# Patient Record
Sex: Male | Born: 1955
Health system: Southern US, Community
[De-identification: ages and names within clinical notes are randomized; demographics above are authoritative.]

## PROBLEM LIST (undated history)

## (undated) DIAGNOSIS — C44310 Basal cell carcinoma of skin of unspecified parts of face: Secondary | ICD-10-CM

## (undated) DIAGNOSIS — Z8489 Family history of other specified conditions: Secondary | ICD-10-CM

## (undated) DIAGNOSIS — F419 Anxiety disorder, unspecified: Secondary | ICD-10-CM

## (undated) DIAGNOSIS — C61 Malignant neoplasm of prostate: Secondary | ICD-10-CM

## (undated) DIAGNOSIS — Z8619 Personal history of other infectious and parasitic diseases: Secondary | ICD-10-CM

## (undated) DIAGNOSIS — H8192 Unspecified disorder of vestibular function, left ear: Secondary | ICD-10-CM

## (undated) DIAGNOSIS — E785 Hyperlipidemia, unspecified: Secondary | ICD-10-CM

## (undated) DIAGNOSIS — B159 Hepatitis A without hepatic coma: Secondary | ICD-10-CM

## (undated) DIAGNOSIS — E8809 Other disorders of plasma-protein metabolism, not elsewhere classified: Secondary | ICD-10-CM

## (undated) DIAGNOSIS — K5730 Diverticulosis of large intestine without perforation or abscess without bleeding: Secondary | ICD-10-CM

## (undated) HISTORY — DX: Anxiety disorder, unspecified: F41.9

## (undated) HISTORY — DX: Diverticulosis of large intestine without perforation or abscess without bleeding: K57.30

## (undated) HISTORY — DX: Personal history of other infectious and parasitic diseases: Z86.19

## (undated) HISTORY — DX: Other disorders of bilirubin metabolism: E80.6

## (undated) HISTORY — DX: Basal cell carcinoma of skin of unspecified parts of face: C44.310

## (undated) HISTORY — PX: CATARACT EXTRACTION, BILATERAL: SHX1313

## (undated) HISTORY — PX: VASECTOMY: SHX75

## (undated) HISTORY — DX: Hyperlipidemia, unspecified: E78.5

---

## 1997-05-28 HISTORY — PX: BASAL CELL CARCINOMA EXCISION: SHX1214

## 1997-08-05 ENCOUNTER — Encounter: Payer: Self-pay | Admitting: Family Medicine

## 1997-08-05 LAB — CONVERTED CEMR LAB
Blood Glucose, Fasting: 103 mg/dL
RBC count: 5.12 10*6/uL
WBC, blood: 11.2 10*3/uL

## 1999-04-12 ENCOUNTER — Encounter: Payer: Self-pay | Admitting: Family Medicine

## 1999-04-12 LAB — CONVERTED CEMR LAB
Blood Glucose, Fasting: 92 mg/dL
RBC count: 5.14 10*6/uL
WBC, blood: 5.4 10*3/uL

## 1999-05-06 ENCOUNTER — Emergency Department (HOSPITAL_COMMUNITY): Admission: EM | Admit: 1999-05-06 | Discharge: 1999-05-07 | Payer: Self-pay | Admitting: Family Medicine

## 1999-05-06 ENCOUNTER — Ambulatory Visit (HOSPITAL_COMMUNITY): Admission: RE | Admit: 1999-05-06 | Discharge: 1999-05-06 | Payer: Self-pay | Admitting: Vascular Surgery

## 1999-05-08 ENCOUNTER — Encounter: Payer: Self-pay | Admitting: Neurology

## 1999-05-08 ENCOUNTER — Ambulatory Visit (HOSPITAL_COMMUNITY): Admission: RE | Admit: 1999-05-08 | Discharge: 1999-05-08 | Payer: Self-pay | Admitting: Neurology

## 2001-01-07 ENCOUNTER — Encounter: Payer: Self-pay | Admitting: Family Medicine

## 2001-01-07 LAB — CONVERTED CEMR LAB
Blood Glucose, Fasting: 99 mg/dL
TSH: 1.69 microintl units/mL

## 2001-03-28 DIAGNOSIS — K5730 Diverticulosis of large intestine without perforation or abscess without bleeding: Secondary | ICD-10-CM

## 2001-03-28 HISTORY — DX: Diverticulosis of large intestine without perforation or abscess without bleeding: K57.30

## 2001-03-29 HISTORY — PX: COLONOSCOPY: SHX174

## 2002-03-13 ENCOUNTER — Encounter: Payer: Self-pay | Admitting: Family Medicine

## 2002-03-13 LAB — CONVERTED CEMR LAB
Blood Glucose, Fasting: 84 mg/dL
TSH: 1.28 microintl units/mL

## 2003-03-18 ENCOUNTER — Encounter: Payer: Self-pay | Admitting: Family Medicine

## 2003-03-18 LAB — CONVERTED CEMR LAB
Blood Glucose, Fasting: 92 mg/dL
TSH: 1.33 microintl units/mL

## 2004-04-10 ENCOUNTER — Ambulatory Visit: Payer: Self-pay | Admitting: Family Medicine

## 2005-05-09 ENCOUNTER — Ambulatory Visit: Payer: Self-pay | Admitting: Family Medicine

## 2005-06-14 ENCOUNTER — Ambulatory Visit: Payer: Self-pay | Admitting: Family Medicine

## 2005-06-29 ENCOUNTER — Ambulatory Visit: Payer: Self-pay | Admitting: Family Medicine

## 2005-07-03 ENCOUNTER — Ambulatory Visit: Payer: Self-pay | Admitting: Family Medicine

## 2006-06-28 DIAGNOSIS — E785 Hyperlipidemia, unspecified: Secondary | ICD-10-CM

## 2006-06-28 HISTORY — DX: Hyperlipidemia, unspecified: E78.5

## 2006-07-01 ENCOUNTER — Ambulatory Visit: Payer: Self-pay | Admitting: Family Medicine

## 2006-07-01 LAB — CONVERTED CEMR LAB
ALT: 49 units/L — ABNORMAL HIGH (ref 0–40)
AST: 29 units/L (ref 0–37)
Albumin: 4.1 g/dL (ref 3.5–5.2)
Alkaline Phosphatase: 49 units/L (ref 39–117)
BUN: 11 mg/dL (ref 6–23)
Bilirubin, Direct: 0.2 mg/dL (ref 0.0–0.3)
Blood Glucose, Fasting: 100 mg/dL
CO2: 30 meq/L (ref 19–32)
Calcium: 9.1 mg/dL (ref 8.4–10.5)
Chloride: 110 meq/L (ref 96–112)
Cholesterol: 208 mg/dL (ref 0–200)
Creatinine, Ser: 1.2 mg/dL (ref 0.4–1.5)
Direct LDL: 159.1 mg/dL
GFR calc Af Amer: 83 mL/min
GFR calc non Af Amer: 69 mL/min
Glucose, Bld: 100 mg/dL — ABNORMAL HIGH (ref 70–99)
HDL: 44.5 mg/dL (ref >39.0)
PSA: 1.19 ng/mL
PSA: 1.19 ng/mL (ref 0.10–4.00)
Potassium: 4.4 meq/L (ref 3.5–5.1)
Sodium: 143 meq/L (ref 135–145)
TSH: 1.27 microintl units/mL
TSH: 1.27 microintl units/mL (ref 0.35–5.50)
Total Bilirubin: 1.5 mg/dL — ABNORMAL HIGH (ref 0.3–1.2)
Total CHOL/HDL Ratio: 4.7
Total Protein: 7.1 g/dL (ref 6.0–8.3)
Triglycerides: 76 mg/dL (ref 0–149)
VLDL: 15 mg/dL (ref 0–40)

## 2006-07-03 ENCOUNTER — Ambulatory Visit: Payer: Self-pay | Admitting: Family Medicine

## 2006-10-07 ENCOUNTER — Ambulatory Visit: Payer: Self-pay | Admitting: Family Medicine

## 2008-03-17 ENCOUNTER — Encounter: Payer: Self-pay | Admitting: Family Medicine

## 2008-03-17 ENCOUNTER — Ambulatory Visit: Payer: Self-pay | Admitting: Family Medicine

## 2008-03-17 DIAGNOSIS — Z8619 Personal history of other infectious and parasitic diseases: Secondary | ICD-10-CM | POA: Insufficient documentation

## 2008-03-17 DIAGNOSIS — E785 Hyperlipidemia, unspecified: Secondary | ICD-10-CM | POA: Insufficient documentation

## 2008-03-17 DIAGNOSIS — K5730 Diverticulosis of large intestine without perforation or abscess without bleeding: Secondary | ICD-10-CM | POA: Insufficient documentation

## 2008-03-17 HISTORY — DX: Diverticulosis of large intestine without perforation or abscess without bleeding: K57.30

## 2008-03-17 HISTORY — DX: Gilbert syndrome: E80.4

## 2008-03-17 LAB — CONVERTED CEMR LAB
Cholesterol, target level: 200 mg/dL
HDL goal, serum: 40 mg/dL
LDL Goal: 160 mg/dL

## 2008-10-20 ENCOUNTER — Encounter: Payer: Self-pay | Admitting: Family Medicine

## 2008-12-01 ENCOUNTER — Telehealth: Payer: Self-pay | Admitting: Family Medicine

## 2008-12-27 ENCOUNTER — Ambulatory Visit: Payer: Self-pay | Admitting: Family Medicine

## 2008-12-27 DIAGNOSIS — F411 Generalized anxiety disorder: Secondary | ICD-10-CM

## 2008-12-27 HISTORY — DX: Generalized anxiety disorder: F41.1

## 2009-03-09 ENCOUNTER — Encounter (INDEPENDENT_AMBULATORY_CARE_PROVIDER_SITE_OTHER): Payer: Self-pay | Admitting: *Deleted

## 2009-03-15 ENCOUNTER — Ambulatory Visit: Payer: Self-pay | Admitting: Family Medicine

## 2009-10-03 ENCOUNTER — Telehealth: Payer: Self-pay | Admitting: Internal Medicine

## 2010-01-02 ENCOUNTER — Encounter (INDEPENDENT_AMBULATORY_CARE_PROVIDER_SITE_OTHER): Payer: Self-pay | Admitting: *Deleted

## 2010-01-13 ENCOUNTER — Encounter (INDEPENDENT_AMBULATORY_CARE_PROVIDER_SITE_OTHER): Payer: Self-pay | Admitting: *Deleted

## 2010-01-31 ENCOUNTER — Encounter (INDEPENDENT_AMBULATORY_CARE_PROVIDER_SITE_OTHER): Payer: Self-pay | Admitting: *Deleted

## 2010-02-01 ENCOUNTER — Ambulatory Visit: Payer: Self-pay | Admitting: Internal Medicine

## 2010-02-15 ENCOUNTER — Ambulatory Visit: Payer: Self-pay | Admitting: Internal Medicine

## 2010-02-15 HISTORY — PX: COLONOSCOPY: SHX174

## 2010-02-15 LAB — HM COLONOSCOPY

## 2010-03-27 ENCOUNTER — Ambulatory Visit: Payer: Self-pay | Admitting: Family Medicine

## 2010-03-28 LAB — CONVERTED CEMR LAB
Albumin: 4.4 g/dL (ref 3.5–5.2)
Alkaline Phosphatase: 63 units/L (ref 39–117)
Basophils Absolute: 0 10*3/uL (ref 0.0–0.1)
CO2: 30 meq/L (ref 19–32)
Calcium: 9.1 mg/dL (ref 8.4–10.5)
Cholesterol: 220 mg/dL — ABNORMAL HIGH (ref 0–200)
Creatinine, Ser: 1.1 mg/dL (ref 0.4–1.5)
Direct LDL: 145.9 mg/dL
Eosinophils Absolute: 0.3 10*3/uL (ref 0.0–0.7)
Glucose, Bld: 88 mg/dL (ref 70–99)
Hemoglobin: 16.5 g/dL (ref 13.0–17.0)
Lymphocytes Relative: 35.1 % (ref 12.0–46.0)
MCHC: 34.9 g/dL (ref 30.0–36.0)
Neutro Abs: 3.8 10*3/uL (ref 1.4–7.7)
PSA: 1.59 ng/mL (ref 0.10–4.00)
RDW: 13 % (ref 11.5–14.6)
Sodium: 141 meq/L (ref 135–145)
TSH: 2 microintl units/mL (ref 0.35–5.50)
Triglycerides: 151 mg/dL — ABNORMAL HIGH (ref 0.0–149.0)

## 2010-03-29 ENCOUNTER — Ambulatory Visit: Payer: Self-pay | Admitting: Family Medicine

## 2010-06-27 NOTE — Miscellaneous (Signed)
Summary: [facility] PV  Clinical Lists Changes  Medications: Added new medication of MOVIPREP 100 GM  SOLR (PEG-KCL-NACL-NASULF-NA ASC-C) As per prep instructions. - Signed Rx of MOVIPREP 100 GM  SOLR (PEG-KCL-NACL-NASULF-NA ASC-C) As per prep instructions.;  #1 x 0;  Signed;  Entered by: Ezra Sites RN;  Authorized by: Hilarie Fredrickson MD;  Method used: Electronically to [facility]. [facility] [phone]*, [address] [place], [place]  [id number], Ph: [phone], Fax: [phone] Observations: Added new observation of ALLERGY REV: Done (02/01/2010 15:36)    Prescriptions: MOVIPREP 100 GM  SOLR (PEG-KCL-NACL-NASULF-NA ASC-C) As per prep instructions.  #1 x 0   Entered by:   Ezra Sites RN   Authorized by:   Hilarie Fredrickson MD   Signed by:   Ezra Sites RN on 02/01/2010   Method used:   Electronically to        [facility]. [address] [phone]* (retail)       [address] [place], [place]  [id number]       Ph: [phone]       Fax: [phone]   RxID:   [id number]

## 2010-06-27 NOTE — Letter (Signed)
Summary: Nadara Eaton letter  [facility] at [facility]  [address] [place], [place] [address]   Phone: [phone]  Fax: [phone]       01/02/2010 MRN: [id number]  RENEE BEALE [address] [facility] [address] [place], [place]  [address]  Dear Mr. BARTOLI,  [facility], and [facility] announce the retirement of Arta Silence, M.D., from full-time practice at the [facility] office effective November 24, 2009 and his plans of returning part-time.  It is important to Dr. Hetty Ely and to our practice that you understand that [facility] has seven physicians in our office for your health care needs.  We will continue to offer the same exceptional care that you have today.    Dr. Hetty Ely has spoken to many of you about his plans for retirement and returning part-time in the fall.   We will continue to work with you through the transition to schedule appointments for you in the office and meet the high standards that [facility] is committed to.   Again, it is with great pleasure that we share the news that Dr. Hetty Ely will return to [facility] in October of 2011 with a reduced schedule.    If you have any questions, or would like to request an appointment with one of our physicians, please call us at [phone] and press the option for Scheduling an appointment.  We take pleasure in providing you with excellent patient care and look forward to seeing you at your next office visit.  Our [facility] Physicians are:  Tillman Abide, M.D. Laurita Quint, M.D. Roxy Manns, M.D. Kerby Nora, M.D. Hannah Beat, M.D. Ruthe Mannan, M.D. We proudly welcomed Raechel Ache, M.D. and Eustaquio Boyden, M.D. to the practice in July/August 2011.  Sincerely,  [facility]
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
## 2010-06-27 NOTE — Progress Notes (Signed)
Summary: Schedule Colonoscopy  Phone Note Outgoing Call Call back at [facility] Phone [phone]   Call placed by: Harlow Mares CMA Duncan Dull),  Oct 03, 2009 4:56 PM Call placed to: Patient Summary of Call: Left message with male at patients home number, she will have patient call back. patient is due for a colonoscopy Initial call taken by: Harlow Mares CMA Duncan Dull),  Oct 03, 2009 4:56 PM  Follow-up for Phone Call        Left message on patients machine to call back. we will mail the pt a letter to remind him that he is due for colonoscopy Follow-up by: Harlow Mares CMA Duncan Dull),  October 27, 2009 3:26 PM

## 2010-06-27 NOTE — Procedures (Signed)
Summary: Colonoscopy  Patient: Christopher Contreras Note: All result statuses are Final unless otherwise noted.  Tests: (1) Colonoscopy (COL)   COL Colonoscopy           DONE (C)     [facility]     [address]     [place], [place]  [address]           COLONOSCOPY PROCEDURE REPORT           PATIENT:  Christopher Contreras, Christopher Contreras  MR#:  [id number]     BIRTHDATE:  04-04-1956, 55 yrs. old  GENDER:  male     ENDOSCOPIST:  Wilhemina Bonito. Eda Keys, MD     REF. BY:  Screening Recall     PROCEDURE DATE:  02/15/2010     PROCEDURE:  Higher-risk screening colonoscopy G0105           ASA CLASS:  Class I     INDICATIONS:  Elevated Risk Screening, family history of colon     cancer - mother age 48. Negative prior exam 03-2001     MEDICATIONS:   Fentanyl 75 mcg IV, Versed 8 mg IV; Benadryl 25mg      IV           DESCRIPTION OF PROCEDURE:   After the risks benefits and     alternatives of the procedure were thoroughly explained, informed     consent was obtained.  Digital rectal exam was performed and     revealed no abnormalities.   The LB [id number] endoscope     was introduced through the anus and advanced to the cecum, which     was identified by both the appendix and ileocecal valve, without     limitations.Time to cecum = 3:58 min. The quality of the prep was     excellent, using MoviPrep.  The instrument was then slowly     withdrawn (time = 13:29 min) as the colon was fully examined.     <<PROCEDUREIMAGES>>           FINDINGS:  Mild diverticulosis was found with a few diverticula     scattered throught the colon.  This was otherwise a normal complete     examination of the colon.  No polyps or cancers were seen.     Retroflexed views in the rectum revealed no abnormalities.    The     scope was then withdrawn from the patient and the procedure     completed.           COMPLICATIONS:  None     ENDOSCOPIC IMPRESSION:     1) Mild diverticulosis     2) Otherwise normal examination     3)  No polyps or cancers           RECOMMENDATIONS:     1) Follow up colonoscopy in 5 years (Family Hx)           ______________________________     Wilhemina Bonito. Eda Keys, MD           CC:  Arta Silence, MD; The Patient           n.     REVISED:  02/15/2010 11:46 AM     eSIGNED:   Wilhemina Bonito. Eda Keys at 02/15/2010 11:46 AM           Elayne Guerin, [id number]  Note: An exclamation mark (!) indicates a result that was not dispersed into the flowsheet. Document Creation  Date: 02/15/2010 11:47 AM _______________________________________________________________________  (1) Order result status: Final Collection or observation date-time: 02/15/2010 11:38 Requested date-time:  Receipt date-time:  Reported date-time:  Referring Physician:   Ordering Physician: Fransico Setters [phone]) Specimen Source:  Source: Launa Grill Order Number: [id number] Lab site:   Appended Document: Colonoscopy    Clinical Lists Changes  Observations: Added new observation of COLONNXTDUE: 01/2015 (02/15/2010 13:47)      Appended Document: Colonoscopy    Clinical Lists Changes

## 2010-06-27 NOTE — Letter (Signed)
Summary: [facility] Instructions  [facility]  [address] [place], [place] [address]   Phone: [phone]  Fax: [phone]       Christopher Contreras    January 18, 1956    MRN: [id number]        Procedure Day Dorna Bloom:  [facility] 02/15/10     Arrival Time:  9:30am     Procedure Time: 10:30am     Location of Procedure:                    _X _  [facility] (4th Floor)                        PREPARATION FOR COLONOSCOPY WITH MOVIPREP   Starting 5 days prior to your procedure  FRIDAY 09/16  do not eat nuts, seeds, popcorn, corn, beans, peas,  salads, or any raw vegetables.  Do not take any fiber supplements (e.g. Metamucil, Citrucel, and Benefiber).  THE DAY BEFORE YOUR PROCEDURE         DATE:  TUESDAY  09/20  1.  Drink clear liquids the entire day-NO SOLID FOOD  2.  Do not drink anything colored red or purple.  Avoid juices with pulp.  No orange juice.  3.  Drink at least 64 oz. (8 glasses) of fluid/clear liquids during the day to prevent dehydration and help the prep work efficiently.  CLEAR LIQUIDS INCLUDE: Water Jello Ice Popsicles Tea (sugar ok, no milk/cream) Powdered fruit flavored drinks Coffee (sugar ok, no milk/cream) Gatorade Juice: apple, white grape, white cranberry  Lemonade Clear bullion, consomm, broth Carbonated beverages (any kind) Strained chicken noodle soup Hard Candy                             4.  In the morning, mix first dose of MoviPrep solution:    Empty 1 Pouch A and 1 Pouch B into the disposable container    Add lukewarm drinking water to the top line of the container. Mix to dissolve    Refrigerate (mixed solution should be used within 24 hrs)  5.  Begin drinking the prep at 5:00 p.m. The MoviPrep container is divided by 4 marks.   Every 15 minutes drink the solution down to the next mark (approximately 8 oz) until the full liter is complete.   6.  Follow completed prep with 16 oz of clear liquid of your choice  (Nothing red or purple).  Continue to drink clear liquids until bedtime.  7.  Before going to bed, mix second dose of MoviPrep solution:    Empty 1 Pouch A and 1 Pouch B into the disposable container    Add lukewarm drinking water to the top line of the container. Mix to dissolve    Refrigerate  THE DAY OF YOUR PROCEDURE      DATE:  [facility]  09/21  Beginning at  5:30 a.m. (5 hours before procedure):         1. Every 15 minutes, drink the solution down to the next mark (approx 8 oz) until the full liter is complete.  2. Follow completed prep with 16 oz. of clear liquid of your choice.    3. You may drink clear liquids until  8:30am  (2 HOURS BEFORE PROCEDURE).   MEDICATION INSTRUCTIONS  Unless otherwise instructed, you should take regular prescription medications with a small sip of water   as  early as possible the morning of your procedure.        OTHER INSTRUCTIONS  You will need a responsible adult at least 53 years of age to accompany you and drive you home.   This person must remain in the waiting room during your procedure.  Wear loose fitting clothing that is easily removed.  Leave jewelry and other valuables at home.  However, you may wish to bring a book to read or  an iPod/MP3 player to listen to music as you wait for your procedure to start.  Remove all body piercing jewelry and leave at home.  Total time from sign-in until discharge is approximately 2-3 hours.  You should go home directly after your procedure and rest.  You can resume normal activities the  day after your procedure.  The day of your procedure you should not:   Drive   Make legal decisions   Operate machinery   Drink alcohol   Return to work  You will receive specific instructions about eating, activities and medications before you leave.    The above instructions have been reviewed and explained to me by   Ezra Sites RN  February 01, 2010 3:58 PM   I fully understand  and can verbalize these instructions _____________________________ Date _________

## 2010-06-27 NOTE — Letter (Signed)
Summary: Previsit letter  [facility]  [address] [place], [place] [address]   Phone: [phone]  Fax: [phone]       01/13/2010 MRN: [id number]  Christopher Contreras [address] [facility] [address] [place], [place]  [address]  Dear [name],  Welcome to the Gastroenterology Division at [facility].    You are scheduled to see a nurse for your pre-procedure visit on 03-03-10 at 3:30p.m. on the 3rd floor at [facility], [address]  We ask that you try to arrive at our office 15 minutes prior to your appointment time to allow for check-in.  Your nurse visit will consist of discussing your medical and surgical history, your immediate family medical history, and your medications.    Please bring a complete list of all your medications or, if you prefer, bring the medication bottles and we will list them.  We will need to be aware of both prescribed and over the counter drugs.  We will need to know exact dosage information as well.  If you are on blood thinners (Coumadin, Plavix, Aggrenox, Ticlid, etc.) please call our office today/prior to your appointment, as we need to consult with your physician about holding your medication.   Please be prepared to read and sign documents such as consent forms, a financial agreement, and acknowledgement forms.  If necessary, and with your consent, a friend or relative is welcome to sit-in on the nurse visit with you.  Please bring your insurance card so that we may make a copy of it.  If your insurance requires a referral to see a specialist, please bring your referral form from your primary care physician.  No co-pay is required for this nurse visit.     If you cannot keep your appointment, please call [phone] to cancel or reschedule prior to your appointment date.  This allows [place] the opportunity to schedule an appointment for another patient in need of care.    Thank you for choosing [facility] for your  medical needs.  We appreciate the opportunity to care for you.  Please visit [place] at our website  to learn more about our practice.                     Sincerely.                                                                                                                   The Gastroenterology Division

## 2011-01-16 ENCOUNTER — Encounter: Payer: Self-pay | Admitting: Family Medicine

## 2011-01-16 ENCOUNTER — Ambulatory Visit (INDEPENDENT_AMBULATORY_CARE_PROVIDER_SITE_OTHER): Payer: 59 | Admitting: Family Medicine

## 2011-01-16 VITALS — BP 126/78 | HR 72 | Temp 98.3°F | Ht 68.0 in | Wt 199.0 lb

## 2011-01-16 DIAGNOSIS — H109 Unspecified conjunctivitis: Secondary | ICD-10-CM

## 2011-01-16 NOTE — Patient Instructions (Signed)
Looks like pink eye - viral infection. No contacts for next 1-2 wks (until better). Use lubricating eye drops (refresh or hypotears, OTC) as well as warm compresses to eye. If fever or worsening discharge, or not better by Friday, let me know.  Pink Eye (Conjunctivitis, Viral) Conjunctivitis is an irritation (inflammation) of the clear membrane that covers the white part of the eye (the conjunctiva). The irritation can also happen on the underside of the eyelids. Conjunctivitis makes the eye red or pink in color. This is what is commonly known as pink eye. Viral conjunctivitis can spread easily (contagious). CAUSES  Infection from virus on the surface of the eye.   Infection from the irritation or injury of nearby tissues such as the eyelids or cornea.   More serious inflammation or infection on the inside of the eye.   Other eye diseases.   The use of certain eye medications.  SYMPTOMS The normally white color of the eye or the underside of the eyelid is usually pink or red in color. The pink eye is usually associated with irritation, tearing and some sensitivity to light. Viral conjunctivitis is often associated with a clear, watery discharge. If a discharge is present, there may also be some blurred vision in the affected eye. DIAGNOSIS Conjunctivitis is diagnosed by an eye exam. The eye specialist looks for changes in the surface tissues of the eye which take on changes characteristic of the specific types of conjunctivitis. A sample of any discharge may be collected on a Q-Tip (sterile swap). The sample will be sent to a lab to see whether or not the inflammation is caused by bacterial or viral infection. TREATMENT Viral conjunctivitis will not respond to medicines that kill germs (antibiotics). Treatment is aimed at stopping a bacterial infection on top of the viral infection. The goal of treatment is to relieve symptoms (such as itching) with antihistamine drops or other eye  medications.  HOME CARE INSTRUCTIONS  To ease discomfort, apply a cool, clean wash cloth to your eye for 10 to 20 minutes, 3 to 4 times a day.   Gently wipe away any drainage from the eye with a warm, wet washcloth or a cotton ball.   Wash your hands often with soap and use paper towels to dry.   Do not share towels or washcloths. This may spread the infection.   Change or wash your pillowcase every day.   You should not use eye make-up until the infection is gone.   Stop using contacts lenses. Ask your eye professional how to sterilize or replace them before using again. This depends on the type of contact lenses used.   Do not touch the edge of the eyelid with the eye drop bottle or ointment tube when applying medications to the affected eye. This will stop you from spreading the infection to the other eye or to others.  SEEK IMMEDIATE MEDICAL CARE IF:  The infection has not improved within 3 days of beginning treatment.   A watery discharge from the eye develops.   Pain in the eye increases.   The redness is spreading.   Vision becomes blurred.   An oral temperature above 101 develops, or as your caregiver suggests.   Facial pain, redness or swelling develops.   Any problems that may be related to the prescribed medicine develop.  MAKE SURE YOU:   Understand these instructions.   Will watch your condition.   Will get help right away if you are not doing  well or get worse.  Document Released: 05/14/2005 Document Re-Released: 04/26/2008 [facility] Patient Information 2011 [place], [place].

## 2011-01-16 NOTE — Progress Notes (Signed)
  Subjective:    Patient ID: Christopher Contreras, male    DOB: 1956-02-08, 55 y.o.   MRN: [id number]  HPI CC: eye infection  3d h/o eye infection, getting worse.  Crusted in am, brown.  Irritated.  Watery.  Not itching.  Cannot remember specific injury.  No vision changes, fevers/chills, congestion, RN, cough.  No pain with eye movements.  No photophobia  No sick contacts at home.  Used to wear contacts, not currently.  Had lasik surgery.  Rarely wears in left eye to help vision at night.  Review of Systems Per HPI    Objective:   Physical Exam  Nursing note and vitals reviewed. Constitutional: He appears well-developed and well-nourished. No distress.  HENT:  Head: Normocephalic and atraumatic.  Mouth/Throat: Oropharynx is clear and moist. No oropharyngeal exudate.  Eyes: EOM and lids are normal. Pupils are equal, round, and reactive to light. No foreign body present in the right eye. No foreign body present in the left eye. Right conjunctiva is not injected. Left conjunctiva is injected.         Bulbar and palpebral injection, limbic sparing EOMI without pain. Extra erythema left lower eyelid midline but no stye noted.  Neck: Normal range of motion. Neck supple.  Lymphadenopathy:    He has no cervical adenopathy.          Assessment & Plan:

## 2011-01-16 NOTE — Assessment & Plan Note (Signed)
Viral conjunctivitis. Treat supportively as per instructions. If red flags or not improved in next 3 days, call us with update.

## 2011-01-19 ENCOUNTER — Telehealth: Payer: Self-pay | Admitting: *Deleted

## 2011-01-19 ENCOUNTER — Encounter: Payer: Self-pay | Admitting: *Deleted

## 2011-01-19 MED ORDER — POLYMYXIN B-TRIMETHOPRIM 10000-0.1 UNIT/ML-% OP SOLN: 1.0000 [drp] | OPHTHALMIC | Status: AC

## 2011-01-19 NOTE — Telephone Encounter (Signed)
Patient advised as instructed via telephone. 

## 2011-01-19 NOTE — Telephone Encounter (Addendum)
Sent in. plz notify pt. If worsening despite this, or fever or worsening pain, to be seen again.

## 2011-01-19 NOTE — Telephone Encounter (Signed)
Pt was seen earlier this week for an eye infection and was told to call back today if not any better- he isn't, in fact eye is worse.  He is asking if an antibiotic can be called to [facility] [address]

## 2011-04-02 ENCOUNTER — Other Ambulatory Visit: Payer: Self-pay

## 2011-04-04 ENCOUNTER — Encounter: Payer: Self-pay | Admitting: Family Medicine

## 2012-05-16 ENCOUNTER — Encounter: Payer: Self-pay | Admitting: Family Medicine

## 2012-05-16 ENCOUNTER — Ambulatory Visit (INDEPENDENT_AMBULATORY_CARE_PROVIDER_SITE_OTHER): Payer: 59 | Admitting: Family Medicine

## 2012-05-16 VITALS — BP 114/78 | HR 116 | Temp 98.5°F | Wt 212.8 lb

## 2012-05-16 DIAGNOSIS — J069 Acute upper respiratory infection, unspecified: Secondary | ICD-10-CM

## 2012-05-16 NOTE — Progress Notes (Signed)
  Subjective:    Patient ID: Christopher Contreras, male    DOB: 07/12/1955, 56 y.o.   MRN: [id number]  HPI CC: feeling ill  3d h/o ST that woke him up from sleep.  Feeling bette during day, then at night starts feeling worse again at night.  Progressive feeling worse as days have progressed.  ++PNdrainage and sinus congestion, body aches, muscle pain.  This afternoon R ear aching.  run down.  Minimal cough.  Last night tried nyquil for sleep which helped some.  No fevers/chills, abd pain, tooth pain, headaches.  No sick contacts at home.  No smoker at home. No h/o asthma/COPD  Did not receive flu shot this year.  Past Medical History  Diagnosis Date  . Diverticulosis of colon 11/02  . HLD (hyperlipidemia) 2/08  . Anxiety   . Disorders of bilirubin excretion   . Personal history of other infectious and parasitic disease   . Basal cell carcinoma of face      Review of Systems Per HPI    Objective:   Physical Exam  Nursing note and vitals reviewed. Constitutional: He appears well-developed and well-nourished. No distress.  HENT:  Head: Normocephalic and atraumatic.  Right Ear: Hearing, tympanic membrane, external ear and ear canal normal.  Left Ear: Hearing, tympanic membrane, external ear and ear canal normal.  Nose: Nose normal. No mucosal edema or rhinorrhea. Right sinus exhibits no maxillary sinus tenderness and no frontal sinus tenderness. Left sinus exhibits no maxillary sinus tenderness and no frontal sinus tenderness.  Mouth/Throat: Uvula is midline and mucous membranes are normal. Posterior oropharyngeal erythema present. No oropharyngeal exudate, posterior oropharyngeal edema or tonsillar abscesses.       Turbinate erythema Soft palate erythema   Eyes: Conjunctivae normal and EOM are normal. Pupils are equal, round, and reactive to light. No scleral icterus.  Neck: Normal range of motion. Neck supple.  Cardiovascular: Normal rate, regular rhythm, normal heart sounds  and intact distal pulses.   No murmur heard. Pulmonary/Chest: Effort normal and breath sounds normal. No respiratory distress. He has no wheezes. He has no rales.  Lymphadenopathy:    He has no cervical adenopathy.  Skin: Skin is warm and dry. No rash noted.       Assessment & Plan:

## 2012-05-16 NOTE — Assessment & Plan Note (Signed)
Mild dehydration as evidenced by mild tachycardia with viral URI possible early sinusitis. supportive care as per instructions.

## 2012-05-16 NOTE — Patient Instructions (Signed)
Sounds like you have a viral upper respiratory infection. Antibiotics are not needed for this.  Viral infections usually take 7-10 days to resolve.  The cough can last a few weeks to go away. Take simple mucinex or guaifenesin alone with plenty of fluid to mobilize mucous. Ibuprofen for sinus inflammation. Push fluids and plenty of rest. Let us know if fever >101, worsening productive cough, symptoms past 10 days, or sudden deterioration after initially improving. I hope you start feeling better. Push fluids as you may be a bit dehydrated.

## 2013-10-06 DIAGNOSIS — M76891 Other specified enthesopathies of right lower limb, excluding foot: Secondary | ICD-10-CM | POA: Insufficient documentation

## 2015-02-16 ENCOUNTER — Encounter: Payer: Self-pay | Admitting: Internal Medicine

## 2015-04-18 ENCOUNTER — Encounter: Payer: Self-pay | Admitting: Internal Medicine

## 2016-07-06 DIAGNOSIS — J208 Acute bronchitis due to other specified organisms: Secondary | ICD-10-CM | POA: Diagnosis not present

## 2016-07-06 DIAGNOSIS — B9689 Other specified bacterial agents as the cause of diseases classified elsewhere: Secondary | ICD-10-CM | POA: Diagnosis not present

## 2016-07-20 ENCOUNTER — Ambulatory Visit (INDEPENDENT_AMBULATORY_CARE_PROVIDER_SITE_OTHER): Payer: 59 | Admitting: Family Medicine

## 2016-07-20 ENCOUNTER — Encounter: Payer: Self-pay | Admitting: Family Medicine

## 2016-07-20 VITALS — BP 128/76 | HR 96 | Temp 98.0°F | Wt 201.0 lb

## 2016-07-20 DIAGNOSIS — R1013 Epigastric pain: Secondary | ICD-10-CM

## 2016-07-20 DIAGNOSIS — R5383 Other fatigue: Secondary | ICD-10-CM

## 2016-07-20 HISTORY — DX: Epigastric pain: R10.13

## 2016-07-20 HISTORY — DX: Other fatigue: R53.83

## 2016-07-20 LAB — COMPREHENSIVE METABOLIC PANEL
ALBUMIN: 4.2 g/dL (ref 3.6–5.1)
ALT: 63 U/L — ABNORMAL HIGH (ref 9–46)
AST: 37 U/L — ABNORMAL HIGH (ref 10–35)
Alkaline Phosphatase: 71 U/L (ref 40–115)
BUN: 12 mg/dL (ref 7–25)
CHLORIDE: 105 mmol/L (ref 98–110)
CO2: 24 mmol/L (ref 20–31)
Calcium: 8.9 mg/dL (ref 8.6–10.3)
Creat: 1.27 mg/dL (ref 0.70–1.33)
Glucose, Bld: 81 mg/dL (ref 65–99)
POTASSIUM: 4.3 mmol/L (ref 3.5–5.3)
SODIUM: 139 mmol/L (ref 135–146)
TOTAL PROTEIN: 7.1 g/dL (ref 6.1–8.1)
Total Bilirubin: 1.4 mg/dL — ABNORMAL HIGH (ref 0.2–1.2)

## 2016-07-20 LAB — CBC WITH DIFFERENTIAL/PLATELET
BASOS ABS: 81 {cells}/uL (ref 0–200)
Basophils Relative: 1 %
EOS ABS: 162 {cells}/uL (ref 15–500)
EOS PCT: 2 %
HCT: 42.9 % (ref 38.5–50.0)
HEMOGLOBIN: 14.7 g/dL (ref 13.2–17.1)
Lymphocytes Relative: 58 %
Lymphs Abs: 4698 cells/uL — ABNORMAL HIGH (ref 850–3900)
MCH: 30.1 pg (ref 27.0–33.0)
MCHC: 34.3 g/dL (ref 32.0–36.0)
MCV: 87.7 fL (ref 80.0–100.0)
MPV: 8.6 fL (ref 7.5–12.5)
Monocytes Absolute: 891 cells/uL (ref 200–950)
Monocytes Relative: 11 %
NEUTROS ABS: 2268 {cells}/uL (ref 1500–7800)
NEUTROS PCT: 28 %
Platelets: 194 10*3/uL (ref 140–400)
RBC: 4.89 MIL/uL (ref 4.20–5.80)
RDW: 14 % (ref 11.0–15.0)
WBC: 8.1 10*3/uL (ref 3.8–10.8)

## 2016-07-20 LAB — TSH: TSH: 1.45 m[IU]/L (ref 0.40–4.50)

## 2016-07-20 MED ORDER — OMEPRAZOLE 40 MG PO CPDR: 40.0000 mg | DELAYED_RELEASE_CAPSULE | Freq: Every day | ORAL | 1 refills | Status: DC

## 2016-07-20 MED ORDER — SUCRALFATE 1 G PO TABS: 1.0000 g | ORAL_TABLET | Freq: Three times a day (TID) | ORAL | 1 refills | Status: DC

## 2016-07-20 NOTE — Patient Instructions (Addendum)
I think the doxycycline and vomiting episode led to some ongoing stomach/esophagus irritation Treat with omeprazole 40mg  daily for 3 weeks as well as carafate with meals.  Update [place] with effect after 2-3 weeks - if no better, we will refer you to GI for further evaluation.  Check with Dr Blanch Media office about repeat colonoscopy.

## 2016-07-20 NOTE — Progress Notes (Signed)
BP 128/76   Pulse 96   Temp 98 F (36.7 C) (Oral)   Wt 201 lb (91.2 kg)   BMI 30.56 kg/m    CC: malaise, ongoing epigastric pain Subjective:    Patient ID: Christopher Contreras, male    DOB: 03-05-1956, 61 y.o.   MRN: [id number]  HPI: Christopher Contreras is a 61 y.o. male presenting on 07/20/2016 for Has been sick for 2 months (has had flu, bronchitis since Christmas; no energy; feels like food is not going all the way down)   Increased family stress 12/2015 when father became ill, then died on trip to [place]. Family stressors present as well - child, strained relationship between sisters and MIL.   He had influenza A after Christmas seen at walk in clinic, treated with tamiflu. Never missed work. Did feel some better, but symptoms recurred. Returned to walk in clinic and received CXR, dx as acute bronchitis, treated with 1 wk doxycycline. This caused GI distress and episode of severe vomiting - this was about 2-3 wks ago. Doxy caused some constipation as well but that has improved off abx.   Over the last 2 weeks, noticing ongoing discomfort in chest described as something stuck in chest, worse with meals, some trouble swallowing. Some GERD and increased belching, indigestion. Some feverish. Occasional abd discomfort described as cramping pain. No more nausea/vomiting, diarrhea. Appetite decreased due to discomfort with meals. No coughing or wheezing. No boring pain to the back. No significant dysphagia, no weight loss, no night sweats. No further vomiting.   Has tried nexium which may have helped some.   Decreased energy over the last few months. Normally walks 50,000 steps. But now tires walking around the house. He did cut down on his schedule to 40hr/wk this year. Some orthostatic dizziness.   No tick bites this past year.  No recent travel.  No sick contacts.   Last seen here 04/2012.   Relevant past medical, surgical, family and social history reviewed and updated as indicated.  Interim medical history since our last visit reviewed. Allergies and medications reviewed and updated. Outpatient Medications Prior to Visit  Medication Sig Dispense Refill  . aspirin 81 MG tablet Take 81 mg by mouth daily.      . Multiple Vitamin (MULTIVITAMIN) tablet Take 1 tablet by mouth daily.       No facility-administered medications prior to visit.      Per HPI unless specifically indicated in ROS section below Review of Systems     Objective:    BP 128/76   Pulse 96   Temp 98 F (36.7 C) (Oral)   Wt 201 lb (91.2 kg)   BMI 30.56 kg/m   Wt Readings from Last 3 Encounters:  07/20/16 201 lb (91.2 kg)  05/16/12 212 lb 12 oz (96.5 kg)  01/16/11 199 lb (90.3 kg)    Physical Exam  Constitutional: He appears well-developed and well-nourished. No distress.  HENT:  Head: Normocephalic and atraumatic.  Mouth/Throat: Oropharynx is clear and moist. No oropharyngeal exudate.  Eyes: Conjunctivae and EOM are normal. Pupils are equal, round, and reactive to light. No scleral icterus.  Neck: Normal range of motion. Neck supple. Carotid bruit is not present. No thyromegaly present.  Cardiovascular: Normal rate, regular rhythm, normal heart sounds and intact distal pulses.   No murmur heard. Pulmonary/Chest: Effort normal and breath sounds normal. No respiratory distress. He has no wheezes. He has no rales.  Abdominal: Soft. Normal appearance and bowel sounds  are normal. He exhibits no distension and no mass. There is no hepatosplenomegaly. There is no tenderness. There is no rigidity, no rebound, no guarding, no CVA tenderness and negative Murphy's sign.  Musculoskeletal: He exhibits no edema.  Lymphadenopathy:    He has no cervical adenopathy.  Skin: Skin is warm and dry. No rash noted.  Psychiatric: He has a normal mood and affect.  Nursing note and vitals reviewed.  Results for orders placed or performed in visit on 01/16/11  HM COLONOSCOPY  Result Value Ref Range   HM  Colonoscopy Done       Assessment & Plan:   Problem List Items Addressed This Visit    Abdominal discomfort, epigastric - Primary    Started after acute vomiting episode after doxycycline course. Anticipate gastritis/esophagitis after doxy/vomiting. Treat with 3 wk PPI and carafate. Update if not improved for GI referral. Pt agrees with plan.       Relevant Orders   CBC with Differential/Platelet   Comprehensive metabolic panel   Lipase   Fatigue    Check labs for reversible causes of fatigue.  Update with results.       Relevant Orders   CBC with Differential/Platelet   TSH   Vitamin B12       Follow up plan: Return if symptoms worsen or fail to improve.  Ria Bush, MD

## 2016-07-20 NOTE — Assessment & Plan Note (Signed)
Started after acute vomiting episode after doxycycline course. Anticipate gastritis/esophagitis after doxy/vomiting. Treat with 3 wk PPI and carafate. Update if not improved for GI referral. Pt agrees with plan.

## 2016-07-20 NOTE — Assessment & Plan Note (Signed)
Check labs for reversible causes of fatigue.  Update with results.

## 2016-07-20 NOTE — Progress Notes (Signed)
Pre visit review using our clinic review tool, if applicable. No additional management support is needed unless otherwise documented below in the visit note. 

## 2016-07-21 LAB — VITAMIN B12: VITAMIN B 12: 618 pg/mL (ref 200–1100)

## 2016-07-21 LAB — LIPASE: LIPASE: 43 U/L (ref 7–60)

## 2016-07-25 ENCOUNTER — Other Ambulatory Visit: Payer: Self-pay | Admitting: Family Medicine

## 2016-07-25 DIAGNOSIS — R5383 Other fatigue: Secondary | ICD-10-CM

## 2016-07-26 ENCOUNTER — Other Ambulatory Visit (INDEPENDENT_AMBULATORY_CARE_PROVIDER_SITE_OTHER): Payer: 59

## 2016-07-26 DIAGNOSIS — R5383 Other fatigue: Secondary | ICD-10-CM | POA: Diagnosis not present

## 2016-07-26 LAB — TESTOSTERONE: TESTOSTERONE: 442.68 ng/dL (ref 300.00–890.00)

## 2016-07-27 ENCOUNTER — Telehealth: Payer: Self-pay | Admitting: Family Medicine

## 2016-07-27 ENCOUNTER — Encounter: Payer: Self-pay | Admitting: Family Medicine

## 2016-07-27 NOTE — Telephone Encounter (Signed)
Patient called to find out his lab results.  He said he's checking my chart for the results.

## 2016-07-27 NOTE — Telephone Encounter (Signed)
Released via mychart

## 2016-10-13 ENCOUNTER — Encounter: Payer: Self-pay | Admitting: Family Medicine

## 2016-10-13 ENCOUNTER — Other Ambulatory Visit: Payer: Self-pay | Admitting: Family Medicine

## 2016-10-13 DIAGNOSIS — R5383 Other fatigue: Secondary | ICD-10-CM

## 2016-10-13 DIAGNOSIS — E785 Hyperlipidemia, unspecified: Secondary | ICD-10-CM

## 2016-10-13 DIAGNOSIS — R740 Nonspecific elevation of levels of transaminase and lactic acid dehydrogenase [LDH]: Secondary | ICD-10-CM

## 2016-10-13 DIAGNOSIS — R7401 Elevation of levels of liver transaminase levels: Secondary | ICD-10-CM

## 2016-10-13 DIAGNOSIS — Z125 Encounter for screening for malignant neoplasm of prostate: Secondary | ICD-10-CM

## 2016-10-16 ENCOUNTER — Encounter: Payer: Self-pay | Admitting: Family Medicine

## 2016-10-16 ENCOUNTER — Other Ambulatory Visit (INDEPENDENT_AMBULATORY_CARE_PROVIDER_SITE_OTHER): Payer: 59

## 2016-10-16 DIAGNOSIS — Z125 Encounter for screening for malignant neoplasm of prostate: Secondary | ICD-10-CM

## 2016-10-16 DIAGNOSIS — R740 Nonspecific elevation of levels of transaminase and lactic acid dehydrogenase [LDH]: Secondary | ICD-10-CM

## 2016-10-16 DIAGNOSIS — E785 Hyperlipidemia, unspecified: Secondary | ICD-10-CM

## 2016-10-16 DIAGNOSIS — R7401 Elevation of levels of liver transaminase levels: Secondary | ICD-10-CM

## 2016-10-16 LAB — FERRITIN: FERRITIN: 145.6 ng/mL (ref 22.0–322.0)

## 2016-10-16 LAB — COMPREHENSIVE METABOLIC PANEL
ALT: 24 U/L (ref 0–53)
AST: 21 U/L (ref 0–37)
Albumin: 4.5 g/dL (ref 3.5–5.2)
Alkaline Phosphatase: 57 U/L (ref 39–117)
BILIRUBIN TOTAL: 2.3 mg/dL — AB (ref 0.2–1.2)
BUN: 15 mg/dL (ref 6–23)
CALCIUM: 9 mg/dL (ref 8.4–10.5)
CO2: 30 meq/L (ref 19–32)
Chloride: 106 mEq/L (ref 96–112)
Creatinine, Ser: 1.17 mg/dL (ref 0.40–1.50)
GFR: 67.93 mL/min (ref >60.00)
Glucose, Bld: 100 mg/dL — ABNORMAL HIGH (ref 70–99)
Potassium: 4.5 mEq/L (ref 3.5–5.1)
Sodium: 142 mEq/L (ref 135–145)
Total Protein: 7 g/dL (ref 6.0–8.3)

## 2016-10-16 LAB — LIPID PANEL
CHOL/HDL RATIO: 4
Cholesterol: 168 mg/dL (ref 0–200)
HDL: 38.6 mg/dL — AB (ref >39.00)
LDL Cholesterol: 110 mg/dL — ABNORMAL HIGH (ref 0–99)
NonHDL: 129.72
TRIGLYCERIDES: 97 mg/dL (ref 0.0–149.0)
VLDL: 19.4 mg/dL (ref 0.0–40.0)

## 2016-10-16 LAB — PSA: PSA: 4.6 ng/mL — ABNORMAL HIGH (ref 0.10–4.00)

## 2016-10-23 DIAGNOSIS — R972 Elevated prostate specific antigen [PSA]: Secondary | ICD-10-CM | POA: Diagnosis not present

## 2016-10-23 DIAGNOSIS — R350 Frequency of micturition: Secondary | ICD-10-CM | POA: Diagnosis not present

## 2016-10-23 DIAGNOSIS — N401 Enlarged prostate with lower urinary tract symptoms: Secondary | ICD-10-CM | POA: Diagnosis not present

## 2016-10-29 DIAGNOSIS — R972 Elevated prostate specific antigen [PSA]: Secondary | ICD-10-CM | POA: Diagnosis not present

## 2016-11-08 DIAGNOSIS — N401 Enlarged prostate with lower urinary tract symptoms: Secondary | ICD-10-CM | POA: Diagnosis not present

## 2016-11-08 DIAGNOSIS — R350 Frequency of micturition: Secondary | ICD-10-CM | POA: Diagnosis not present

## 2016-11-08 DIAGNOSIS — R972 Elevated prostate specific antigen [PSA]: Secondary | ICD-10-CM | POA: Diagnosis not present

## 2016-11-12 ENCOUNTER — Encounter: Payer: Self-pay | Admitting: Family Medicine

## 2016-11-12 DIAGNOSIS — N400 Enlarged prostate without lower urinary tract symptoms: Secondary | ICD-10-CM | POA: Insufficient documentation

## 2016-11-12 HISTORY — DX: Benign prostatic hyperplasia without lower urinary tract symptoms: N40.0

## 2017-02-13 ENCOUNTER — Ambulatory Visit (INDEPENDENT_AMBULATORY_CARE_PROVIDER_SITE_OTHER): Payer: 59 | Admitting: Family Medicine

## 2017-02-13 ENCOUNTER — Encounter: Payer: Self-pay | Admitting: Family Medicine

## 2017-02-13 VITALS — BP 112/62 | HR 58 | Temp 98.1°F

## 2017-02-13 DIAGNOSIS — N529 Male erectile dysfunction, unspecified: Secondary | ICD-10-CM | POA: Insufficient documentation

## 2017-02-13 DIAGNOSIS — R070 Pain in throat: Secondary | ICD-10-CM

## 2017-02-13 HISTORY — DX: Male erectile dysfunction, unspecified: N52.9

## 2017-02-13 MED ORDER — SILDENAFIL CITRATE 100 MG PO TABS: 50.0000 mg | ORAL_TABLET | Freq: Every day | ORAL | 3 refills | Status: DC | PRN

## 2017-02-13 NOTE — Assessment & Plan Note (Addendum)
RST today. Overall benign exam. ?viral pharyngitis. No signs of bacterial pharyngitis or deep tissue throat infection.  Treat conservatively with NSAID, honey/lemon, herbal tea, popsicles, salt water gargles Red flags to return discussed.

## 2017-02-13 NOTE — Patient Instructions (Signed)
You may have viral pharyngitis.  Treat with ibuprofen 600mg  3 times a day with meals for next 5 days, may use honey with lemon, herbal tea, popsicles, salt water gargles - anything that can soothe the throat.  If not improving with this, let me know for ENT referral.  If worsening pain, fever >101, trouble opening mouth, or hoarser voice, let me know sooner.

## 2017-02-13 NOTE — Progress Notes (Signed)
BP 112/62 (BP Location: Left Arm, Patient Position: Sitting, Cuff Size: Normal)   Pulse (!) 58   Temp 98.1 F (36.7 C) (Oral)   SpO2 98%    CC: ST, earache Subjective:    Patient ID: [name], male    DOB: [birth year], 61 y.o.   MRN: [id number]  HPI: NICOLIS BOODY is a 61 y.o. male presenting on 02/13/2017 for Sore Throat (Started 02/07/17) and Otalgia   6d h/o L earache with radiation down to throat whenever eating or drinking anything. Progressively worsening. Preceded by mild congestion initially. Odynophagia > chewing. Pain with both eating and drinking.   Treating with tylenol and advil but this doesn't help when eating. Pain waking him up at night time.   No fevers/chills, no trismus, cough, headache, post nasal drainage or facial pain. No hoarse voice. No GERD sxs.  No sick contacts at home.  Non smoker Rare alcohol.  Oh by the way - requests viagra refill for ED - remotely on this and it was effective.   Relevant past medical, surgical, family and social history reviewed and updated as indicated. Interim medical history since our last visit reviewed. Allergies and medications reviewed and updated. Outpatient Medications Prior to Visit  Medication Sig Dispense Refill  . Multiple Vitamin (MULTIVITAMIN) tablet Take 1 tablet by mouth daily.      Marland Kitchen omeprazole (PRILOSEC) 40 MG capsule Take 1 capsule (40 mg total) by mouth daily. For 3 weeks then as needed 30 capsule 1  . aspirin 81 MG tablet Take 81 mg by mouth daily.      . sucralfate (CARAFATE) 1 g tablet Take 1 tablet (1 g total) by mouth 3 (three) times daily before meals. 60 tablet 1   No facility-administered medications prior to visit.      Per HPI unless specifically indicated in ROS section below Review of Systems     Objective:    BP 112/62 (BP Location: Left Arm, Patient Position: Sitting, Cuff Size: Normal)   Pulse (!) 58   Temp 98.1 F (36.7 C) (Oral)   SpO2 98%   Wt Readings from Last 3  Encounters:  07/20/16 201 lb (91.2 kg)  05/16/12 212 lb 12 oz (96.5 kg)  01/16/11 199 lb (90.3 kg)    Physical Exam  Constitutional: He appears well-developed and well-nourished. No distress.  HENT:  Head: Normocephalic and atraumatic.  Right Ear: Hearing, tympanic membrane, external ear and ear canal normal.  Left Ear: Hearing, tympanic membrane, external ear and ear canal normal.  Nose: Nose normal. No mucosal edema or rhinorrhea. Right sinus exhibits no maxillary sinus tenderness and no frontal sinus tenderness. Left sinus exhibits no maxillary sinus tenderness and no frontal sinus tenderness.  Mouth/Throat: Uvula is midline and mucous membranes are normal. Posterior oropharyngeal erythema (mild) present. No oropharyngeal exudate, posterior oropharyngeal edema or tonsillar abscesses.  Mild nasal mucosal congestion No dental pain to palpation Normal stetson's duct No parotid swelling or other salivary gland swelling  Eyes: Pupils are equal, round, and reactive to light. Conjunctivae and EOM are normal. No scleral icterus.  Neck: Normal range of motion. Neck supple. No thyromegaly present.  Cardiovascular: Normal rate, regular rhythm, normal heart sounds and intact distal pulses.   No murmur heard. Pulmonary/Chest: Effort normal and breath sounds normal. No respiratory distress. He has no wheezes. He has no rales.  Lymphadenopathy:       Head (right side): Tonsillar (mild) adenopathy present. No submental, no submandibular, no preauricular, no  posterior auricular and no occipital adenopathy present.       Head (left side): No submental, no submandibular, no tonsillar, no preauricular, no posterior auricular and no occipital adenopathy present.    He has no cervical adenopathy.  Skin: Skin is warm and dry. No rash noted.  Nursing note and vitals reviewed.     Assessment & Plan:   Problem List Items Addressed This Visit    Erectile dysfunction    Discussed generic vs brand viagra. Pt  prefers to try brand. Coupon provided today. Has previously tolerated well.      Throat pain in adult - Primary    RST today. Overall benign exam. ?viral pharyngitis. No signs of bacterial pharyngitis or deep tissue throat infection.  Treat conservatively with NSAID, honey/lemon, herbal tea, popsicles, salt water gargles Red flags to return discussed.          Follow up plan: Return if symptoms worsen or fail to improve.  Ria Bush, MD

## 2017-02-13 NOTE — Assessment & Plan Note (Addendum)
Discussed generic vs brand viagra. Pt prefers to try brand. Coupon provided today. Has previously tolerated well.

## 2017-04-16 DIAGNOSIS — H43813 Vitreous degeneration, bilateral: Secondary | ICD-10-CM | POA: Diagnosis not present

## 2017-05-03 DIAGNOSIS — R350 Frequency of micturition: Secondary | ICD-10-CM | POA: Diagnosis not present

## 2017-05-03 DIAGNOSIS — N401 Enlarged prostate with lower urinary tract symptoms: Secondary | ICD-10-CM | POA: Diagnosis not present

## 2017-05-03 DIAGNOSIS — R972 Elevated prostate specific antigen [PSA]: Secondary | ICD-10-CM | POA: Diagnosis not present

## 2017-05-30 DIAGNOSIS — Z85828 Personal history of other malignant neoplasm of skin: Secondary | ICD-10-CM | POA: Diagnosis not present

## 2017-05-30 DIAGNOSIS — Z1283 Encounter for screening for malignant neoplasm of skin: Secondary | ICD-10-CM | POA: Diagnosis not present

## 2017-05-30 DIAGNOSIS — D229 Melanocytic nevi, unspecified: Secondary | ICD-10-CM | POA: Diagnosis not present

## 2017-06-04 DIAGNOSIS — H524 Presbyopia: Secondary | ICD-10-CM | POA: Diagnosis not present

## 2017-08-28 DIAGNOSIS — L853 Xerosis cutis: Secondary | ICD-10-CM | POA: Diagnosis not present

## 2017-08-28 DIAGNOSIS — L570 Actinic keratosis: Secondary | ICD-10-CM | POA: Diagnosis not present

## 2017-09-13 DIAGNOSIS — M62830 Muscle spasm of back: Secondary | ICD-10-CM | POA: Diagnosis not present

## 2017-09-13 DIAGNOSIS — M545 Low back pain: Secondary | ICD-10-CM | POA: Diagnosis not present

## 2017-09-13 DIAGNOSIS — M9903 Segmental and somatic dysfunction of lumbar region: Secondary | ICD-10-CM | POA: Diagnosis not present

## 2017-09-16 DIAGNOSIS — M62830 Muscle spasm of back: Secondary | ICD-10-CM | POA: Diagnosis not present

## 2017-09-16 DIAGNOSIS — M9903 Segmental and somatic dysfunction of lumbar region: Secondary | ICD-10-CM | POA: Diagnosis not present

## 2017-09-16 DIAGNOSIS — M545 Low back pain: Secondary | ICD-10-CM | POA: Diagnosis not present

## 2017-09-18 DIAGNOSIS — M62830 Muscle spasm of back: Secondary | ICD-10-CM | POA: Diagnosis not present

## 2017-09-18 DIAGNOSIS — M545 Low back pain: Secondary | ICD-10-CM | POA: Diagnosis not present

## 2017-09-18 DIAGNOSIS — M9903 Segmental and somatic dysfunction of lumbar region: Secondary | ICD-10-CM | POA: Diagnosis not present

## 2017-09-20 DIAGNOSIS — M62830 Muscle spasm of back: Secondary | ICD-10-CM | POA: Diagnosis not present

## 2017-09-20 DIAGNOSIS — M545 Low back pain: Secondary | ICD-10-CM | POA: Diagnosis not present

## 2017-09-20 DIAGNOSIS — M9903 Segmental and somatic dysfunction of lumbar region: Secondary | ICD-10-CM | POA: Diagnosis not present

## 2017-11-08 DIAGNOSIS — N401 Enlarged prostate with lower urinary tract symptoms: Secondary | ICD-10-CM | POA: Diagnosis not present

## 2017-11-08 DIAGNOSIS — R972 Elevated prostate specific antigen [PSA]: Secondary | ICD-10-CM | POA: Diagnosis not present

## 2018-01-10 ENCOUNTER — Encounter: Payer: Self-pay | Admitting: Internal Medicine

## 2018-01-10 ENCOUNTER — Ambulatory Visit (INDEPENDENT_AMBULATORY_CARE_PROVIDER_SITE_OTHER): Payer: 59 | Admitting: Internal Medicine

## 2018-01-10 VITALS — BP 116/74 | HR 74 | Temp 97.9°F | Ht 68.0 in | Wt 194.0 lb

## 2018-01-10 DIAGNOSIS — L237 Allergic contact dermatitis due to plants, except food: Secondary | ICD-10-CM

## 2018-01-10 DIAGNOSIS — L259 Unspecified contact dermatitis, unspecified cause: Secondary | ICD-10-CM | POA: Insufficient documentation

## 2018-01-10 MED ORDER — TRIAMCINOLONE ACETONIDE 0.1 % EX CREA: 1.0000 "application " | TOPICAL_CREAM | Freq: Two times a day (BID) | CUTANEOUS | 1 refills | Status: DC | PRN

## 2018-01-10 NOTE — Assessment & Plan Note (Signed)
Based on time course, probably just about peaking in intensity Does okay with left over cream---can hold off on prednisone TAC If continues to worsen, would try prednisone

## 2018-01-10 NOTE — Progress Notes (Signed)
Subjective:    Patient ID: Christopher Contreras, male    DOB: 26-Apr-1956, 62 y.o.   MRN: [id number]  HPI Here due to rash Was trimming bushes and trees last weekend Started with rash and itching the next day Has cream that is helping some--but rash is worsening Concerned due to still spreading some  Current Outpatient Medications on File Prior to Visit  Medication Sig Dispense Refill  . Multiple Vitamin (MULTIVITAMIN) tablet Take 1 tablet by mouth daily.      . sildenafil (VIAGRA) 100 MG tablet Take 0.5-1 tablets (50-100 mg total) by mouth daily as needed for erectile dysfunction. 5 tablet 3   No current facility-administered medications on file prior to visit.     Allergies  Allergen Reactions  . Sulfonamide Derivatives     Past Medical History:  Diagnosis Date  . Anxiety   . Basal cell carcinoma of face   . Disorders of bilirubin excretion   . Diverticulosis of colon 11/02  . HLD (hyperlipidemia) 2/08  . Personal history of other infectious and parasitic disease     Past Surgical History:  Procedure Laterality Date  . BASAL CELL CARCINOMA EXCISION  1/99   right face  . COLONOSCOPY  03/29/01   Diverticulosis  . COLONOSCOPY  02/15/10   [facility]-Dr. Henrene Pastor  . VASECTOMY  pre 1999   Dr. Rogers Blocker    Family History  Problem Relation Age of Onset  . Prostate cancer Father        s/p prostatectomy  . Other Father        Back pain with neuropathy  . Colon cancer Mother 12  . Colon polyps Sister   . Colon polyps Sister   . Heart attack Unknown        Maternal side  . Hypertension Unknown        Both sides  . Diabetes Son   . Diabetes Paternal Grandfather   . Bone cancer Maternal Grandmother     Social History   Socioeconomic History  . Marital status: Married    Spouse name: Not on file  . Number of children: 3  . Years of education: Not on file  . Highest education level: Not on file  Occupational History  . Occupation: Retired  Scientific laboratory technician  . Financial  resource strain: Not on file  . Food insecurity:    Worry: Not on file    Inability: Not on file  . Transportation needs:    Medical: Not on file    Non-medical: Not on file  Tobacco Use  . Smoking status: Never Smoker  . Smokeless tobacco: Never Used  Substance and Sexual Activity  . Alcohol use: Yes    Comment: Rare  . Drug use: No  . Sexual activity: Not on file  Lifestyle  . Physical activity:    Days per week: Not on file    Minutes per session: Not on file  . Stress: Not on file  Relationships  . Social connections:    Talks on phone: Not on file    Gets together: Not on file    Attends religious service: Not on file    Active member of club or organization: Not on file    Attends meetings of clubs or organizations: Not on file    Relationship status: Not on file  . Intimate partner violence:    Fear of current or ex partner: Not on file    Emotionally abused: Not on file  Physically abused: Not on file    Forced sexual activity: Not on file  Other Topics Concern  . Not on file  Social History Narrative   Married; lives with wife      3 sons      Retired International aid/development worker; [employer]         Review of Systems No fever Feels fine    Objective:   Physical Exam  Skin:  Lacy redness across left posterior calf and part of right Some satellite lesions More classic linear papulovesicular areas on right hand and left forearm/hand            Assessment & Plan:

## 2018-02-03 DIAGNOSIS — L821 Other seborrheic keratosis: Secondary | ICD-10-CM | POA: Diagnosis not present

## 2018-02-03 DIAGNOSIS — L820 Inflamed seborrheic keratosis: Secondary | ICD-10-CM | POA: Diagnosis not present

## 2018-03-18 DIAGNOSIS — J209 Acute bronchitis, unspecified: Secondary | ICD-10-CM | POA: Diagnosis not present

## 2018-03-18 DIAGNOSIS — R05 Cough: Secondary | ICD-10-CM | POA: Diagnosis not present

## 2018-05-15 DIAGNOSIS — R972 Elevated prostate specific antigen [PSA]: Secondary | ICD-10-CM | POA: Diagnosis not present

## 2018-05-15 DIAGNOSIS — N401 Enlarged prostate with lower urinary tract symptoms: Secondary | ICD-10-CM | POA: Diagnosis not present

## 2018-05-26 ENCOUNTER — Other Ambulatory Visit: Payer: Self-pay | Admitting: Urology

## 2018-05-26 ENCOUNTER — Other Ambulatory Visit (HOSPITAL_COMMUNITY): Payer: Self-pay | Admitting: Urology

## 2018-05-26 DIAGNOSIS — R972 Elevated prostate specific antigen [PSA]: Secondary | ICD-10-CM

## 2018-06-02 DIAGNOSIS — L570 Actinic keratosis: Secondary | ICD-10-CM | POA: Diagnosis not present

## 2018-06-02 DIAGNOSIS — D485 Neoplasm of uncertain behavior of skin: Secondary | ICD-10-CM | POA: Diagnosis not present

## 2018-06-02 DIAGNOSIS — Z1283 Encounter for screening for malignant neoplasm of skin: Secondary | ICD-10-CM | POA: Diagnosis not present

## 2018-06-02 DIAGNOSIS — L578 Other skin changes due to chronic exposure to nonionizing radiation: Secondary | ICD-10-CM | POA: Diagnosis not present

## 2018-06-02 DIAGNOSIS — Z85828 Personal history of other malignant neoplasm of skin: Secondary | ICD-10-CM | POA: Diagnosis not present

## 2018-06-10 ENCOUNTER — Ambulatory Visit
Admission: RE | Admit: 2018-06-10 | Discharge: 2018-06-10 | Disposition: A | Discharge status: Home / Self Care | Payer: BLUE CROSS/BLUE SHIELD | Source: Ambulatory Visit | Attending: Urology | Admitting: Urology

## 2018-06-10 DIAGNOSIS — R972 Elevated prostate specific antigen [PSA]: Secondary | ICD-10-CM

## 2018-06-10 LAB — POCT I-STAT CREATININE: Creatinine, Ser: 1.3 mg/dL — ABNORMAL HIGH (ref 0.61–1.24)

## 2018-06-10 MED ORDER — GADOBUTROL 1 MMOL/ML IV SOLN
9.0000 mL | Freq: Once | INTRAVENOUS | Status: AC | PRN
  Administered 2018-06-10: 9 mL via INTRAVENOUS

## 2018-06-12 DIAGNOSIS — N401 Enlarged prostate with lower urinary tract symptoms: Secondary | ICD-10-CM | POA: Diagnosis not present

## 2018-06-12 DIAGNOSIS — R972 Elevated prostate specific antigen [PSA]: Secondary | ICD-10-CM | POA: Diagnosis not present

## 2018-06-26 ENCOUNTER — Other Ambulatory Visit

## 2018-07-01 ENCOUNTER — Ambulatory Visit: Admit: 2018-07-01 | Admitting: Urology

## 2018-07-01 DIAGNOSIS — R972 Elevated prostate specific antigen [PSA]: Secondary | ICD-10-CM | POA: Diagnosis not present

## 2018-07-01 SURGERY — BIOPSY, PROSTATE
Anesthesia: Choice

## 2018-07-08 DIAGNOSIS — R972 Elevated prostate specific antigen [PSA]: Secondary | ICD-10-CM | POA: Diagnosis not present

## 2018-07-08 DIAGNOSIS — C61 Malignant neoplasm of prostate: Secondary | ICD-10-CM | POA: Diagnosis not present

## 2018-07-09 ENCOUNTER — Encounter: Payer: Self-pay | Admitting: Family Medicine

## 2018-07-09 DIAGNOSIS — C61 Malignant neoplasm of prostate: Secondary | ICD-10-CM | POA: Insufficient documentation

## 2018-07-09 DIAGNOSIS — Z8546 Personal history of malignant neoplasm of prostate: Secondary | ICD-10-CM | POA: Insufficient documentation

## 2018-07-22 DIAGNOSIS — C61 Malignant neoplasm of prostate: Secondary | ICD-10-CM | POA: Diagnosis not present

## 2018-07-25 ENCOUNTER — Encounter: Payer: Self-pay | Admitting: Family Medicine

## 2018-07-28 ENCOUNTER — Telehealth: Payer: Self-pay | Admitting: Family Medicine

## 2018-07-28 DIAGNOSIS — Z1211 Encounter for screening for malignant neoplasm of colon: Secondary | ICD-10-CM

## 2018-07-28 NOTE — Telephone Encounter (Signed)
Pt need referral for colonoscopy for [place]. Pt don't need an appt until end of march and beyond

## 2018-07-28 NOTE — Telephone Encounter (Signed)
Referral placed. Pt request after end of march due to other healthcare appointments.

## 2018-08-04 ENCOUNTER — Encounter: Payer: Self-pay | Admitting: Radiation Oncology

## 2018-08-04 NOTE — Progress Notes (Signed)
GU Location of Tumor / Histology: prostatic adenocarcinoma  If Prostate Cancer, Gleason Score is (3 + 4) and PSA is (10.9). Prostate volume: 37.7 cc.   KENTRELL HALLAHAN has a strong family hx of prostate ca. He underwent an initial prostate biopsy in June 2018 for a PSA of 4.6. The biopsy was benign. His PSA increased to 10.9 of January 2020.   Biopsies of prostate (if applicable) revealed:    Past/Anticipated interventions by urology, if any: prostate biopsy (negative), MR/US fusion biopsy, referral for consideration of radiation therapy  Past/Anticipated interventions by medical oncology, if any: no  Weight changes, if any: no  Bowel/Bladder complaints, if any: SHIM 25. IPSS 2. Denies dysuria or hematuria. Reports minimal intermittent post void dribble.    Nausea/Vomiting, if any: no  Pain issues, if any:  no  SAFETY ISSUES:  Prior radiation? no  Pacemaker/ICD? no  Possible current pregnancy? no, male patient  Is the patient on methotrexate? no  Current Complaints / other details:  63 year old male. Married with one daughter and two sons. Works as an Glass blower/designer. Prostate cancer in Grandfather (tx with radiation), father (tx with seeds), and uncle (tx with surgery). Patient denies taking any routine medications. Resides in [place], [place]   Patient leaning toward surgical treatment.

## 2018-08-05 ENCOUNTER — Other Ambulatory Visit: Payer: Self-pay

## 2018-08-05 ENCOUNTER — Encounter: Payer: Self-pay | Admitting: Medical Oncology

## 2018-08-05 ENCOUNTER — Ambulatory Visit
Admission: RE | Admit: 2018-08-05 | Discharge: 2018-08-05 | Disposition: A | Discharge status: Home / Self Care | Payer: BLUE CROSS/BLUE SHIELD | Source: Ambulatory Visit | Attending: Radiation Oncology | Admitting: Radiation Oncology

## 2018-08-05 ENCOUNTER — Encounter: Payer: Self-pay | Admitting: Radiation Oncology

## 2018-08-05 ENCOUNTER — Encounter: Payer: Self-pay | Admitting: Family Medicine

## 2018-08-05 VITALS — BP 125/84 | HR 68 | Temp 98.0°F | Resp 18 | Ht 68.0 in | Wt 194.2 lb

## 2018-08-05 DIAGNOSIS — Z8042 Family history of malignant neoplasm of prostate: Secondary | ICD-10-CM | POA: Insufficient documentation

## 2018-08-05 DIAGNOSIS — Z8719 Personal history of other diseases of the digestive system: Secondary | ICD-10-CM | POA: Diagnosis not present

## 2018-08-05 DIAGNOSIS — Z85828 Personal history of other malignant neoplasm of skin: Secondary | ICD-10-CM | POA: Insufficient documentation

## 2018-08-05 DIAGNOSIS — E785 Hyperlipidemia, unspecified: Secondary | ICD-10-CM | POA: Insufficient documentation

## 2018-08-05 DIAGNOSIS — C61 Malignant neoplasm of prostate: Secondary | ICD-10-CM | POA: Diagnosis not present

## 2018-08-05 DIAGNOSIS — F419 Anxiety disorder, unspecified: Secondary | ICD-10-CM | POA: Insufficient documentation

## 2018-08-05 DIAGNOSIS — R972 Elevated prostate specific antigen [PSA]: Secondary | ICD-10-CM | POA: Diagnosis not present

## 2018-08-05 HISTORY — DX: Malignant neoplasm of prostate: C61

## 2018-08-05 NOTE — Progress Notes (Signed)
Radiation Oncology         [phone] ________________________________  Initial Outpatient Consultation  Name: Christopher Contreras MRN: [id number]  Date: 08/05/2018  DOB: 1955-12-11  [id number], Garlon Hatchet, MD  Raynelle Bring, MD   REFERRING PHYSICIAN: Raynelle Bring, MD  DIAGNOSIS: 63 y.o. gentleman with Stage T1c adenocarcinoma of the prostate with Gleason score of 3+4, and PSA of 10.9.    ICD-10-CM   1. Malignant neoplasm of prostate ([place]) C61     HISTORY OF PRESENT ILLNESS: Christopher Contreras is a 63 y.o. male with a diagnosis of prostate cancer. He has a strong family history of prostate cancer and has been undergoing routine PSA screening. He was noted to have an elevated PSA of 4.6 and underwent an initial prostate biopsy in June 2018 by Dr. Maryan Puls. This biopsy was benign. However, his PSA has continued to rise and was increased to 10.9 in December 2019. This prompted an MRI of the prostate, performed on 06/10/2018, that showed a 1.7 cm PI-RADS 5 nodule in the left anterior apex which involves the anterior peripheral zone, transition zone, and fibromuscular stroma. There was no evidence of extracapsular extension or pelvic metastatic disease. He was self-referred to Dr. Alinda Money for a second opinion on 07/01/2018, and a digital rectal examination was performed at that time revealing no prostate nodules.  The patient proceeded to MRI fusion biopsy of the prostate on 07/08/2018.  The prostate volume measured 37.7 cc.  Out of 16 total core biopsies, 5 were positive.  The maximum Gleason score was 3+4, and this was seen in 4 out of 4 targeted ROI biopsy samples. Additionally, there was Gleason 3+3 disease seen in the right apex.  Biopsies of prostate revealed:    The patient reviewed the biopsy results with his urologist and he has kindly been referred today for discussion of potential radiation treatment options.  PREVIOUS RADIATION THERAPY: No  PAST MEDICAL HISTORY:  Past Medical  History:  Diagnosis Date  . Anxiety   . Basal cell carcinoma of face   . Disorders of bilirubin excretion   . Diverticulosis of colon 11/02  . HLD (hyperlipidemia) 2/08  . Personal history of other infectious and parasitic disease   . Prostate cancer (Home)       PAST SURGICAL HISTORY: Past Surgical History:  Procedure Laterality Date  . BASAL CELL CARCINOMA EXCISION  1/99   right face  . COLONOSCOPY  03/29/01   Diverticulosis  . COLONOSCOPY  02/15/10   [facility]-Dr. Henrene Pastor  . VASECTOMY  pre 1999   Dr. Rogers Blocker    FAMILY HISTORY:  Family History  Problem Relation Age of Onset  . Prostate cancer Father        s/p prostatectomy  . Other Father        Back pain with neuropathy  . Colon cancer Mother 89  . Colon polyps Sister   . Colon polyps Sister   . Heart attack Other        Maternal side  . Hypertension Other        Both sides  . Diabetes Son   . Diabetes Paternal Grandfather   . Bone cancer Maternal Grandmother     SOCIAL HISTORY:  Social History   Socioeconomic History  . Marital status: Married    Spouse name: Not on file  . Number of children: 3  . Years of education: Not on file  . Highest education level: Not on file  Occupational History  . Occupation: Retired  Social Needs  . Financial resource strain: Not on file  . Food insecurity:    Worry: Not on file    Inability: Not on file  . Transportation needs:    Medical: Not on file    Non-medical: Not on file  Tobacco Use  . Smoking status: Never Smoker  . Smokeless tobacco: Never Used  Substance and Sexual Activity  . Alcohol use: Yes    Comment: Rare  . Drug use: No  . Sexual activity: Yes  Lifestyle  . Physical activity:    Days per week: Not on file    Minutes per session: Not on file  . Stress: Not on file  Relationships  . Social connections:    Talks on phone: Not on file    Gets together: Not on file    Attends religious service: Not on file    Active member of club or  organization: Not on file    Attends meetings of clubs or organizations: Not on file    Relationship status: Not on file  . Intimate partner violence:    Fear of current or ex partner: Not on file    Emotionally abused: Not on file    Physically abused: Not on file    Forced sexual activity: Not on file  Other Topics Concern  . Not on file  Social History Narrative   Married; lives with wife      3 sons      Retired International aid/development worker; [employer]          ALLERGIES: Sulfonamide derivatives  MEDICATIONS:  Current Outpatient Medications  Medication Sig Dispense Refill  . sildenafil (VIAGRA) 100 MG tablet Take 0.5-1 tablets (50-100 mg total) by mouth daily as needed for erectile dysfunction. (Patient not taking: Reported on 08/05/2018) 5 tablet 3  . triamcinolone cream (KENALOG) 0.1 % Apply 1 application topically 2 (two) times daily as needed. (Patient not taking: Reported on 06/16/2018) 45 g 1   No current facility-administered medications for this encounter.     REVIEW OF SYSTEMS:  On review of systems, the patient reports that he is doing well overall. He denies any chest pain, shortness of breath, cough, fevers, chills, night sweats, or unintended weight changes. He denies any bowel disturbances, and denies abdominal pain, nausea or vomiting. He denies any new musculoskeletal or joint aches or pains. His IPSS was 2, indicating mild urinary symptoms with minimal intermittent post-void dribble. He denies dysuria or hematuria. His SHIM was 25, indicating he does not have erectile dysfunction. A complete review of systems is obtained and is otherwise negative.    PHYSICAL EXAM:  Wt Readings from Last 3 Encounters:  08/05/18 194 lb 3.2 oz (88.1 kg)  01/10/18 194 lb (88 kg)  07/20/16 201 lb (91.2 kg)   Temp Readings from Last 3 Encounters:  08/05/18 98 F (36.7 C)  01/10/18 97.9 F (36.6 C) (Oral)  02/13/17 98.1 F (36.7 C) (Oral)   BP Readings from Last 3 Encounters:  08/05/18 125/84    01/10/18 116/74  02/13/17 112/62   Pulse Readings from Last 3 Encounters:  08/05/18 68  01/10/18 74  02/13/17 (!) 58   Pain Assessment Pain Score: 0-No pain/10  In general this is a well appearing caucasian male in no acute distress. He is alert and oriented x4 and appropriate throughout the examination. HEENT reveals that the patient is normocephalic, atraumatic. EOMs are intact. PERRLA. Skin is intact without any evidence of gross lesions. Cardiovascular exam reveals  a regular rate and rhythm, no clicks rubs or murmurs are auscultated. Chest is clear to auscultation bilaterally. Lymphatic assessment is performed and does not reveal any adenopathy in the cervical, supraclavicular, axillary, or inguinal chains. Abdomen has active bowel sounds in all quadrants and is intact. The abdomen is soft, non tender, non distended. Lower extremities are negative for pretibial pitting edema, deep calf tenderness, cyanosis or clubbing.   KPS = 100  100 - Normal; no complaints; no evidence of disease. 90   - Able to carry on normal activity; minor signs or symptoms of disease. 80   - Normal activity with effort; some signs or symptoms of disease. 29   - Cares for self; unable to carry on normal activity or to do active work. 60   - Requires occasional assistance, but is able to care for most of his personal needs. 50   - Requires considerable assistance and frequent medical care. 53   - Disabled; requires special care and assistance. 36   - Severely disabled; hospital admission is indicated although death not imminent. 39   - Very sick; hospital admission necessary; active supportive treatment necessary. 10   - Moribund; fatal processes progressing rapidly. 0     - Dead  Karnofsky DA, Abelmann Souris, Craver LS and [facility] [phone]) The use of the nitrogen mustards in the palliative treatment of carcinoma: with particular reference to bronchogenic carcinoma Cancer 1 634-56  LABORATORY DATA:  Lab  Results  Component Value Date   WBC 8.1 07/20/2016   HGB 14.7 07/20/2016   HCT 42.9 07/20/2016   MCV 87.7 07/20/2016   PLT 194 07/20/2016   Lab Results  Component Value Date   NA 142 10/16/2016   K 4.5 10/16/2016   CL 106 10/16/2016   CO2 30 10/16/2016   Lab Results  Component Value Date   ALT 24 10/16/2016   AST 21 10/16/2016   ALKPHOS 57 10/16/2016   BILITOT 2.3 (H) 10/16/2016     RADIOGRAPHY: No results found.    IMPRESSION/PLAN: 1. 63 y.o. gentleman with Stage T1c adenocarcinoma of the prostate with Gleason Score of 3+4, and PSA of 10.9. We discussed the patient's workup and outlined the nature of prostate cancer in this setting. The patient's T stage, Gleason's score, and PSA put him into the favorable-intermediate risk group. Accordingly, he is eligible for a variety of potential treatment options including brachytherapy, 5.5 weeks of external radiation, or radical prostatectomy. We discussed the available radiation techniques, and focused on the details of logistics and delivery. We discussed and outlined the risks, benefits, short and long-term effects associated with radiotherapy and compared and contrasted these with prostatectomy. We also discussed the role of SpaceOAR in reducing the rectal toxicity associated with radiotherapy.   The patient focused most of his questions and interest in robotic-assisted laparoscopic radical prostatectomy. We discussed some of the potential advantages of surgery including surgical staging, the availability of salvage radiotherapy to the prostatic fossa, and the confidence associated with immediate biochemical response. We discussed some of the potential proven indications for postoperative radiotherapy including positive margins, extracapsular extension, and seminal vesicle involvement. We also talked about some of the other potential findings leading to a recommendation for radiotherapy including a non-zero postoperative PSA and positive  lymph nodes.  He was encouraged to ask questions that were answered to his stated satisfaction.  At the end of the conversation the patient is undecided about which treatment option he would like to pursue at this  time but is leaning towards radical prostatectomy.  He will let us know if he decides to pursue radiotherapy and in that case, we would be more than happy to continue to participate in his care.  Otherwise, we will look forward to following his progress. He was encouraged to call with any additional questions.  We spent 60 minutes face to face with the patient and more than 50% of that time was spent in counseling and/or coordination of care.    Nicholos Johns, PA-C    Tyler Pita, MD  [facility] Direct Dial: [phone]  Fax: [phone] [email or web address]  Skype  [email or web address]  This document serves as a record of services personally performed by Tyler Pita, MD and Freeman Caldron, PA-C. It was created on their behalf by Rae Lips, a trained medical scribe. The creation of this record is based on the scribe's personal observations and the providers' statements to them. This document has been checked and approved by the attending providers.

## 2018-08-05 NOTE — Progress Notes (Signed)
See progress note under physician encounter. 

## 2018-08-06 NOTE — Telephone Encounter (Signed)
See pt. Email.

## 2018-08-14 DIAGNOSIS — M6281 Muscle weakness (generalized): Secondary | ICD-10-CM | POA: Diagnosis not present

## 2018-08-14 DIAGNOSIS — C61 Malignant neoplasm of prostate: Secondary | ICD-10-CM | POA: Diagnosis not present

## 2018-08-21 DIAGNOSIS — N393 Stress incontinence (female) (male): Secondary | ICD-10-CM | POA: Diagnosis not present

## 2018-08-21 DIAGNOSIS — M6281 Muscle weakness (generalized): Secondary | ICD-10-CM | POA: Diagnosis not present

## 2018-08-21 DIAGNOSIS — M62838 Other muscle spasm: Secondary | ICD-10-CM | POA: Diagnosis not present

## 2018-08-27 ENCOUNTER — Other Ambulatory Visit: Payer: Self-pay | Admitting: Urology

## 2018-08-29 NOTE — Progress Notes (Signed)
Christopher Contreras very interested in prostatectomy but here today to discuss radiation treatment options. After consult he has decided on surgery.

## 2018-09-09 ENCOUNTER — Encounter (HOSPITAL_COMMUNITY): Payer: Self-pay

## 2018-09-09 ENCOUNTER — Inpatient Hospital Stay (HOSPITAL_COMMUNITY): Admission: RE | Admit: 2018-09-09 | Payer: BLUE CROSS/BLUE SHIELD | Source: Ambulatory Visit

## 2018-09-10 NOTE — Pre-Procedure Instructions (Signed)
[facility]  09/10/2018      [id number] [address] [place] [address] Phone: [phone] Fax: [phone]    Your procedure is scheduled on Monday April 20th.  Report to [facility] Admitting at 10:00 A.M.  Call this number if you have problems the morning of surgery:  [phone]   Remember:  Do not eat or drink after midnight.    Take these medicines the morning of surgery with A SIP OF WATER NONE  7 days prior to surgery STOP taking any Aspirin(unless otherwise instructed by your surgeon), Aleve, Naproxen, Ibuprofen, Motrin, Advil, Goody's, BC's, all herbal medications, fish oil, and all vitamins  Your doctor has ordered for you to drink 8oz of Magnesium Citrate at [facility] the day before your surgery (09/14/18). Your doctor has also ordered for you to use 1 FLEET's Enema the night before your surgery.    Do not wear jewelry.  Do not wear lotions, powders, or colognes, or deodorant.  Men may shave face and neck.  Do not bring valuables to the hospital.  [facility] is not responsible for any belongings or valuables.  Contacts, eyeglasses, hearing aids, dentures or bridgework may not be worn into surgery.  Leave your suitcase in the car.  After surgery it may be brought to your room.  For patients admitted to the hospital, discharge time will be determined by your treatment team.  Patients discharged the day of surgery will not be allowed to drive home.   [facility]- Preparing For Surgery  Before surgery, you can play an important role. Because skin is not sterile, your skin needs to be as free of germs as possible. You can reduce the number of germs on your skin by washing with CHG (chlorahexidine gluconate) Soap before surgery.  CHG is an antiseptic cleaner which kills germs and bonds with the skin to continue killing germs even after washing.    Oral  Hygiene is also important to reduce your risk of infection.  Remember - BRUSH YOUR TEETH THE MORNING OF SURGERY WITH YOUR REGULAR TOOTHPASTE  Please do not use if you have an allergy to CHG or antibacterial soaps. If your skin becomes reddened/irritated stop using the CHG.  Do not shave (including legs and underarms) for at least 48 hours prior to first CHG shower. It is OK to shave your face.  Please follow these instructions carefully.   1. Shower the NIGHT BEFORE SURGERY and the MORNING OF SURGERY with CHG.   2. If you chose to wash your hair, wash your hair first as usual with your normal shampoo.  3. After you shampoo, rinse your hair and body thoroughly to remove the shampoo.  4. Use CHG as you would any other liquid soap. You can apply CHG directly to the skin and wash gently with a scrungie or a clean washcloth.   5. Apply the CHG Soap to your body ONLY FROM THE NECK DOWN.  Do not use on open wounds or open sores. Avoid contact with your eyes, ears, mouth and genitals (private parts). Wash Face and genitals (private parts)  with your normal soap.  6. Wash thoroughly, paying special attention to the area where your surgery will be performed.  7. Thoroughly rinse your body with warm water from the neck down.  8. DO NOT shower/wash with your normal soap after using and  rinsing off the CHG Soap.  9. Pat yourself dry with a CLEAN TOWEL.  10. Wear CLEAN PAJAMAS to bed the night before surgery, wear comfortable clothes the morning of surgery  11. Place CLEAN SHEETS on your bed the night of your first shower and DO NOT SLEEP WITH PETS.    Day of Surgery: Shower as stated above. Do not apply any deodorants/lotions.  Please wear clean clothes to the hospital/surgery center.   Remember to brush your teeth WITH YOUR REGULAR TOOTHPASTE.   Please read over the following fact sheets that you were given.

## 2018-09-11 ENCOUNTER — Encounter (HOSPITAL_COMMUNITY)
Admission: RE | Admit: 2018-09-11 | Discharge: 2018-09-11 | Disposition: A | Discharge status: Home / Self Care | Payer: BLUE CROSS/BLUE SHIELD | Source: Ambulatory Visit | Attending: Urology | Admitting: Urology

## 2018-09-11 ENCOUNTER — Other Ambulatory Visit: Payer: Self-pay

## 2018-09-11 ENCOUNTER — Encounter (HOSPITAL_COMMUNITY): Payer: Self-pay

## 2018-09-11 DIAGNOSIS — C61 Malignant neoplasm of prostate: Secondary | ICD-10-CM | POA: Diagnosis not present

## 2018-09-11 DIAGNOSIS — Z01812 Encounter for preprocedural laboratory examination: Secondary | ICD-10-CM | POA: Insufficient documentation

## 2018-09-11 HISTORY — DX: Hepatitis a without hepatic coma: B15.9

## 2018-09-11 HISTORY — DX: Unspecified disorder of vestibular function, left ear: H81.92

## 2018-09-11 HISTORY — DX: Other disorders of plasma-protein metabolism, not elsewhere classified: E88.09

## 2018-09-11 HISTORY — DX: Family history of other specified conditions: Z84.89

## 2018-09-11 LAB — CBC
HCT: 46.6 % (ref 39.0–52.0)
Hemoglobin: 15.8 g/dL (ref 13.0–17.0)
MCH: 30 pg (ref 26.0–34.0)
MCHC: 33.9 g/dL (ref 30.0–36.0)
MCV: 88.6 fL (ref 80.0–100.0)
Platelets: 181 10*3/uL (ref 150–400)
RBC: 5.26 MIL/uL (ref 4.22–5.81)
RDW: 12.3 % (ref 11.5–15.5)
WBC: 6.2 10*3/uL (ref 4.0–10.5)
nRBC: 0 % (ref 0.0–0.2)

## 2018-09-11 LAB — TYPE AND SCREEN
ABO/RH(D): A NEG
Antibody Screen: NEGATIVE

## 2018-09-11 LAB — BASIC METABOLIC PANEL
Anion gap: 9 (ref 5–15)
BUN: 14 mg/dL (ref 6–20)
CO2: 24 mmol/L (ref 22–32)
Calcium: 9.2 mg/dL (ref 8.9–10.3)
Chloride: 105 mmol/L (ref 98–111)
Creatinine, Ser: 1.15 mg/dL (ref 0.61–1.24)
GFR calc Af Amer: >60 mL/min (ref >60)
GFR calc non Af Amer: >60 mL/min (ref >60)
Glucose, Bld: 99 mg/dL (ref 70–99)
Potassium: 4.2 mmol/L (ref 3.5–5.1)
Sodium: 138 mmol/L (ref 135–145)

## 2018-09-11 LAB — ABO/RH: ABO/RH(D): A NEG

## 2018-09-11 NOTE — Progress Notes (Signed)
Patient reports family hx of pseudocholinesterase deficiency. He states this was told to him by his sister but patient unable to recall if any of his blood relatives have ever had any complications with general anesthesia; he states " I'd have to ask my sister more about it". Patient does remember that his sister told him their mother and father are both difficult to arouse after having anesthesia. Patient attempted to contact his sister while here at pre-op appt but was unable to get her on the phone. Jeneen Rinks, [place] made aware. Patient hard chart to be given to [place], [place] for F/U.

## 2018-09-11 NOTE — Anesthesia Preprocedure Evaluation (Addendum)
Anesthesia Evaluation  Patient identified by MRN, date of birth, ID band Patient awake  General Assessment Comment:Family Hx of Pseudocholinesterase deficiency   Reviewed: Allergy & Precautions, NPO status , Patient's Chart, lab work & pertinent test results  History of Anesthesia Complications (+) PSEUDOCHOLINESTERASE DEFICIENCY, DIFFICULT IV STICK / SPECIAL LINE and Family history of anesthesia reaction  Airway Mallampati: I  TM Distance: >3 FB Neck ROM: Full    Dental no notable dental hx. (+) Teeth Intact, Dental Advisory Given   Pulmonary    Pulmonary exam normal breath sounds clear to auscultation       Cardiovascular Normal cardiovascular exam Rhythm:Regular Rate:Normal     Neuro/Psych negative neurological ROS     GI/Hepatic   Endo/Other  negative endocrine ROS  Renal/GU Cr 1.15 K+ 4.2   Prostate CA    Musculoskeletal negative musculoskeletal ROS (+)   Abdominal   Peds  Hematology Hgb 15.8 Plt 181   Anesthesia Other Findings All: sulfa  Reproductive/Obstetrics                            Anesthesia Physical Anesthesia Plan  ASA: II  Anesthesia Plan: General   Post-op Pain Management:    Induction: Intravenous, Cricoid pressure planned and Rapid sequence  PONV Risk Score and Plan: 2 and Treatment may vary due to age or medical condition, Ondansetron and Dexamethasone  Airway Management Planned: Oral ETT  Additional Equipment:   Intra-op Plan:   Post-operative Plan: Extubation in OR  Informed Consent: I have reviewed the patients History and Physical, chart, labs and discussed the procedure including the risks, benefits and alternatives for the proposed anesthesia with the patient or authorized representative who has indicated his/her understanding and acceptance.     Dental advisory given  Plan Discussed with: CRNA  Anesthesia Plan Comments: (Pt reported he  believes he has a family history of pseudocholinesterase deficiency but is unsure of the details. His sister, Gaye Alken, is an OR nurse at [employer], and he said she would know the details and provided contact information. I spoke with [place] and she stated that both their mother and maternal grandmother had bloodwork that was positive for pseudocholinesterase deficiency. Testing was prompted by their great aunt requiring prolonged intubation following surgery. Neither the pt nor his sister have themselves been tested. Given family history, succinylcholine and mivacurium should be avoided. )     Anesthesia Quick Evaluation

## 2018-09-12 ENCOUNTER — Other Ambulatory Visit: Payer: Self-pay | Admitting: Urology

## 2018-09-12 NOTE — H&P (Signed)
CC/HPI: CC: Prostate Cancer   PCP: Dr. Ria Bush   [name] is a 63 year old gentleman with a history of an elevated PSA who underwent his initial prostate biopsy in June 2018 by Dr. Mare Ferrari for an elevated PSA of 4.6. His biopsy was benign. His PSA continued to increased up to 10.9 and he underwent an MRI of the prostate on 06/10/18 that indicated a 1.7 cm PI-RADS 5 lesion of the left anterior apex. He presented to me for a 2nd opinion and elected to undergo an MR/US fusion biopsy on 07/08/18. This confirmed Gleason 3+4=7 (Prognostic grade group 2) adenocarcinoma in 4 out of 4 targeted biopsies with 5 out of 16 total cores positive for malignancy.   Family history: He has a strong family history of prostate cancer including his father, multiple uncles, and a grandfather.   Imaging studies: MRI (06/10/18) - No EPE, SVI, or LAD.   PMH: He has no major medical comorbidities.  PSH: No abdominal surgeries.   TNM stage: cT1c N0 Mx  PSA: 10.9  Gleason score: 3+4=7  Biopsy (07/08/18): 5/16 cores positive  Left: Benign  Right: R apex (10%, 3+3=6)  MR targets: L ant apex (4/4 cores - 40%, 30%, 30%, 30%, 3+4=7)  Prostate volume: 37.7 cc   Nomogram  OC disease: 43%  EPE: 56%  SVI: 3%  LNI: 3%  PFS (5 year, 10 year): 82%, 71%   Urinary function: IPSS is 0.  Erectile function: SHIM score is 20.     ALLERGIES: Sulfa    MEDICATIONS: Diazepam 10 mg tablet 1 tablet PO 30-60 minutes prior to procedure     GU PSH: Prostate Needle Biopsy - 07/08/2018    NON-GU PSH: Cataract surgery, Bilateral Surgical Pathology, Gross And Microscopic Examination For Prostate Needle - 07/08/2018    GU PMH: Elevated PSA - 07/01/2018    NON-GU PMH: Hepatitis A    FAMILY HISTORY: 1 Daughter - Daughter 2 sons - Son Colon Cancer - Mother Death In The Family Father - Father Death In The Family Mother - Mother Kidney Stones - Mother, Father Prostate Cancer - Grandfather, Father, Uncle    SOCIAL HISTORY: Marital Status: Married Current Smoking Status: Patient has never smoked.   Tobacco Use Assessment Completed: Used Tobacco in last 30 days? Drinks 1 drink per month.  Drinks 1 caffeinated drink per day. Patient's occupation Chartered certified accountant.    REVIEW OF SYSTEMS:    GU Review Male:   Patient denies frequent urination, hard to postpone urination, burning/ pain with urination, get up at night to urinate, leakage of urine, stream starts and stops, trouble starting your streams, and have to strain to urinate .  Gastrointestinal (Lower):   Patient denies diarrhea and constipation.  Gastrointestinal (Upper):   Patient denies nausea and vomiting.  Constitutional:   Patient denies fever, night sweats, weight loss, and fatigue.  Skin:   Patient denies skin rash/ lesion and itching.  Eyes:   Patient denies blurred vision and double vision.  Ears/ Nose/ Throat:   Patient denies sore throat and sinus problems.  Hematologic/Lymphatic:   Patient denies swollen glands and easy bruising.  Cardiovascular:   Patient denies leg swelling and chest pains.  Respiratory:   Patient denies cough and shortness of breath.  Endocrine:   Patient denies excessive thirst.  Musculoskeletal:   Patient denies back pain and joint pain.  Neurological:   Patient denies headaches and dizziness.  Psychologic:   Patient denies  depression and anxiety.   VITAL SIGNS:     Weight 190 lb / 86.18 kg  Height 68 in / 172.72 cm  BMI 28.9 kg/m   MULTI-SYSTEM PHYSICAL EXAMINATION:    Constitutional: Well-nourished. No physical deformities. Normally developed. Good grooming.  Respiratory: No labored breathing, no use of accessory muscles.   Cardiovascular: Normal temperature, normal extremity pulses, no swelling, no varicosities.     ASSESSMENT:      ICD-10 Details  1 GU:   Prostate Cancer - C61    PLAN:     1. Prostate cancer: He has elected surgical treatment and will undergo a bilateral  nerve-sparing robot assisted laparoscopic radical prostatectomy and pelvic lymphadenectomy. I discussed the potential benefits and risks of the procedure, side effects of the proposed treatment, the likelihood of the patient achieving the goals of the procedure, and any potential problems that might occur during the procedure or recuperation.

## 2018-09-15 ENCOUNTER — Ambulatory Visit (HOSPITAL_COMMUNITY): Payer: BLUE CROSS/BLUE SHIELD | Admitting: Physician Assistant

## 2018-09-15 ENCOUNTER — Observation Stay (HOSPITAL_COMMUNITY)
Admission: RE | Admit: 2018-09-15 | Discharge: 2018-09-16 | Disposition: A | Discharge status: Home / Self Care | Payer: BLUE CROSS/BLUE SHIELD | Attending: Urology | Admitting: Urology

## 2018-09-15 ENCOUNTER — Encounter: Payer: Self-pay | Admitting: Medical Oncology

## 2018-09-15 ENCOUNTER — Encounter (HOSPITAL_COMMUNITY): Payer: Self-pay | Admitting: *Deleted

## 2018-09-15 ENCOUNTER — Other Ambulatory Visit: Payer: Self-pay

## 2018-09-15 ENCOUNTER — Encounter (HOSPITAL_COMMUNITY)
Admission: RE | Disposition: A | Discharge status: Home / Self Care | Payer: Self-pay | Source: Home / Self Care | Attending: Urology

## 2018-09-15 DIAGNOSIS — Z882 Allergy status to sulfonamides status: Secondary | ICD-10-CM | POA: Diagnosis not present

## 2018-09-15 DIAGNOSIS — C61 Malignant neoplasm of prostate: Principal | ICD-10-CM | POA: Insufficient documentation

## 2018-09-15 DIAGNOSIS — Z8042 Family history of malignant neoplasm of prostate: Secondary | ICD-10-CM | POA: Diagnosis not present

## 2018-09-15 DIAGNOSIS — E785 Hyperlipidemia, unspecified: Secondary | ICD-10-CM | POA: Diagnosis not present

## 2018-09-15 DIAGNOSIS — I10 Essential (primary) hypertension: Secondary | ICD-10-CM | POA: Diagnosis not present

## 2018-09-15 DIAGNOSIS — Z8546 Personal history of malignant neoplasm of prostate: Secondary | ICD-10-CM | POA: Diagnosis present

## 2018-09-15 DIAGNOSIS — N400 Enlarged prostate without lower urinary tract symptoms: Secondary | ICD-10-CM | POA: Diagnosis not present

## 2018-09-15 HISTORY — PX: LYMPHADENECTOMY: SHX5960

## 2018-09-15 HISTORY — PX: ROBOT ASSISTED LAPAROSCOPIC RADICAL PROSTATECTOMY: SHX5141

## 2018-09-15 LAB — HEMOGLOBIN AND HEMATOCRIT, BLOOD
HCT: 40.9 % (ref 39.0–52.0)
Hemoglobin: 14.3 g/dL (ref 13.0–17.0)

## 2018-09-15 LAB — TYPE AND SCREEN
ABO/RH(D): A NEG
Antibody Screen: NEGATIVE

## 2018-09-15 LAB — ABO/RH: ABO/RH(D): A NEG

## 2018-09-15 SURGERY — XI ROBOTIC ASSISTED LAPAROSCOPIC RADICAL PROSTATECTOMY LEVEL 2
Anesthesia: General

## 2018-09-15 MED ORDER — GABAPENTIN 300 MG PO CAPS
300.0000 mg | ORAL_CAPSULE | Freq: Once | ORAL | Status: AC
  Administered 2018-09-15: 300 mg via ORAL

## 2018-09-15 MED ORDER — PHENYLEPHRINE 40 MCG/ML (10ML) SYRINGE FOR IV PUSH (FOR BLOOD PRESSURE SUPPORT)
PREFILLED_SYRINGE | INTRAVENOUS | Status: DC | PRN
  Administered 2018-09-15: 40 ug via INTRAVENOUS
  Administered 2018-09-15: 80 ug via INTRAVENOUS

## 2018-09-15 MED ORDER — ROCURONIUM BROMIDE 10 MG/ML (PF) SYRINGE
PREFILLED_SYRINGE | INTRAVENOUS | Status: AC
  Filled 2018-09-15: qty 10

## 2018-09-15 MED ORDER — CEFAZOLIN SODIUM-DEXTROSE 2-4 GM/100ML-% IV SOLN
2.0000 g | Freq: Once | INTRAVENOUS | Status: AC
  Administered 2018-09-15: 2 g via INTRAVENOUS
  Filled 2018-09-15: qty 100

## 2018-09-15 MED ORDER — BELLADONNA ALKALOIDS-OPIUM 16.2-60 MG RE SUPP
1.0000 | Freq: Four times a day (QID) | RECTAL | Status: DC | PRN
  Administered 2018-09-15: 1 via RECTAL

## 2018-09-15 MED ORDER — KETOROLAC TROMETHAMINE 15 MG/ML IJ SOLN
15.0000 mg | Freq: Four times a day (QID) | INTRAMUSCULAR | Status: DC
  Administered 2018-09-15 – 2018-09-16 (×4): 15 mg via INTRAVENOUS
  Filled 2018-09-15 (×4): qty 1

## 2018-09-15 MED ORDER — GABAPENTIN 300 MG PO CAPS
ORAL_CAPSULE | ORAL | Status: AC
  Filled 2018-09-15: qty 1

## 2018-09-15 MED ORDER — GLYCOPYRROLATE PF 0.2 MG/ML IJ SOSY
PREFILLED_SYRINGE | INTRAMUSCULAR | Status: AC
  Filled 2018-09-15: qty 1

## 2018-09-15 MED ORDER — BUPIVACAINE HCL (PF) 0.5 % IJ SOLN
INTRAMUSCULAR | Status: DC | PRN
  Administered 2018-09-15: 26 mL

## 2018-09-15 MED ORDER — DOCUSATE SODIUM 100 MG PO CAPS
100.0000 mg | ORAL_CAPSULE | Freq: Two times a day (BID) | ORAL | Status: DC
  Administered 2018-09-15 – 2018-09-16 (×2): 100 mg via ORAL
  Filled 2018-09-15 (×2): qty 1

## 2018-09-15 MED ORDER — ONDANSETRON HCL 4 MG/2ML IJ SOLN
INTRAMUSCULAR | Status: AC
  Filled 2018-09-15: qty 2

## 2018-09-15 MED ORDER — ACETAMINOPHEN 10 MG/ML IV SOLN: 1000.0000 mg | Freq: Once | INTRAVENOUS | Status: DC | PRN

## 2018-09-15 MED ORDER — FENTANYL CITRATE (PF) 100 MCG/2ML IJ SOLN
INTRAMUSCULAR | Status: DC | PRN
  Administered 2018-09-15 (×5): 50 ug via INTRAVENOUS

## 2018-09-15 MED ORDER — FENTANYL CITRATE (PF) 100 MCG/2ML IJ SOLN
25.0000 ug | INTRAMUSCULAR | Status: DC | PRN
  Administered 2018-09-15: 50 ug via INTRAVENOUS

## 2018-09-15 MED ORDER — LACTATED RINGERS IV SOLN
INTRAVENOUS | Status: DC
  Administered 2018-09-15 (×2): via INTRAVENOUS

## 2018-09-15 MED ORDER — ESMOLOL HCL 100 MG/10ML IV SOLN
INTRAVENOUS | Status: DC | PRN
  Administered 2018-09-15: 30 mg via INTRAVENOUS

## 2018-09-15 MED ORDER — ESMOLOL HCL 100 MG/10ML IV SOLN
INTRAVENOUS | Status: AC
  Filled 2018-09-15: qty 10

## 2018-09-15 MED ORDER — DIPHENHYDRAMINE HCL 50 MG/ML IJ SOLN: 12.5000 mg | Freq: Four times a day (QID) | INTRAMUSCULAR | Status: DC | PRN

## 2018-09-15 MED ORDER — ONDANSETRON HCL 4 MG/2ML IJ SOLN: 4.0000 mg | INTRAMUSCULAR | Status: DC | PRN

## 2018-09-15 MED ORDER — FLEET ENEMA 7-19 GM/118ML RE ENEM: 1.0000 | ENEMA | Freq: Once | RECTAL | Status: DC

## 2018-09-15 MED ORDER — LIDOCAINE 2% (20 MG/ML) 5 ML SYRINGE
INTRAMUSCULAR | Status: DC | PRN
  Administered 2018-09-15: 40 mg via INTRAVENOUS

## 2018-09-15 MED ORDER — CIPROFLOXACIN HCL 500 MG PO TABS: 500.0000 mg | ORAL_TABLET | Freq: Two times a day (BID) | ORAL | 0 refills | Status: DC

## 2018-09-15 MED ORDER — ACETAMINOPHEN 500 MG PO TABS
ORAL_TABLET | ORAL | Status: AC
  Filled 2018-09-15: qty 2

## 2018-09-15 MED ORDER — ZOLPIDEM TARTRATE 5 MG PO TABS: 5.0000 mg | ORAL_TABLET | Freq: Every evening | ORAL | Status: DC | PRN

## 2018-09-15 MED ORDER — CEFAZOLIN SODIUM-DEXTROSE 1-4 GM/50ML-% IV SOLN
1.0000 g | Freq: Three times a day (TID) | INTRAVENOUS | Status: AC
  Administered 2018-09-15 – 2018-09-16 (×2): 1 g via INTRAVENOUS
  Filled 2018-09-15 (×2): qty 50

## 2018-09-15 MED ORDER — PROPOFOL 10 MG/ML IV BOLUS
INTRAVENOUS | Status: AC
  Filled 2018-09-15: qty 20

## 2018-09-15 MED ORDER — BACITRACIN-NEOMYCIN-POLYMYXIN 400-5-5000 EX OINT: 1.0000 "application " | TOPICAL_OINTMENT | Freq: Three times a day (TID) | CUTANEOUS | Status: DC | PRN

## 2018-09-15 MED ORDER — ACETAMINOPHEN 325 MG PO TABS: 650.0000 mg | ORAL_TABLET | ORAL | Status: DC | PRN

## 2018-09-15 MED ORDER — MORPHINE SULFATE (PF) 4 MG/ML IV SOLN: 2.0000 mg | INTRAVENOUS | Status: DC | PRN

## 2018-09-15 MED ORDER — ONDANSETRON HCL 4 MG/2ML IJ SOLN
INTRAMUSCULAR | Status: DC | PRN
  Administered 2018-09-15: 4 mg via INTRAVENOUS

## 2018-09-15 MED ORDER — ONDANSETRON HCL 4 MG/2ML IJ SOLN: 4.0000 mg | Freq: Once | INTRAMUSCULAR | Status: DC | PRN

## 2018-09-15 MED ORDER — HEPARIN SODIUM (PORCINE) 1000 UNIT/ML IJ SOLN
INTRAMUSCULAR | Status: AC
  Filled 2018-09-15: qty 1

## 2018-09-15 MED ORDER — GLYCOPYRROLATE 0.2 MG/ML IJ SOLN
INTRAMUSCULAR | Status: DC | PRN
  Administered 2018-09-15 (×2): 0.1 mg via INTRAVENOUS

## 2018-09-15 MED ORDER — LACTATED RINGERS IV SOLN
INTRAVENOUS | Status: DC | PRN
  Administered 2018-09-15: 12:00:00 1000 mL

## 2018-09-15 MED ORDER — MAGNESIUM CITRATE PO SOLN: 1.0000 | Freq: Once | ORAL | Status: DC

## 2018-09-15 MED ORDER — BELLADONNA ALKALOIDS-OPIUM 16.2-60 MG RE SUPP
RECTAL | Status: AC
  Filled 2018-09-15: qty 1

## 2018-09-15 MED ORDER — MIDAZOLAM HCL 2 MG/2ML IJ SOLN
INTRAMUSCULAR | Status: AC
  Filled 2018-09-15: qty 2

## 2018-09-15 MED ORDER — FENTANYL CITRATE (PF) 250 MCG/5ML IJ SOLN
INTRAMUSCULAR | Status: AC
  Filled 2018-09-15: qty 5

## 2018-09-15 MED ORDER — LIDOCAINE 2% (20 MG/ML) 5 ML SYRINGE
INTRAMUSCULAR | Status: AC
  Filled 2018-09-15: qty 5

## 2018-09-15 MED ORDER — LIDOCAINE 20MG/ML (2%) 15 ML SYRINGE OPTIME
INTRAMUSCULAR | Status: DC | PRN
  Administered 2018-09-15: 1.5 mg/kg/h via INTRAVENOUS

## 2018-09-15 MED ORDER — ROCURONIUM BROMIDE 10 MG/ML (PF) SYRINGE
PREFILLED_SYRINGE | INTRAVENOUS | Status: DC | PRN
  Administered 2018-09-15: 15 mg via INTRAVENOUS
  Administered 2018-09-15: 85 mg via INTRAVENOUS

## 2018-09-15 MED ORDER — KETAMINE HCL 10 MG/ML IJ SOLN
INTRAMUSCULAR | Status: DC | PRN
  Administered 2018-09-15: 45 mg via INTRAVENOUS

## 2018-09-15 MED ORDER — DIPHENHYDRAMINE HCL 12.5 MG/5ML PO ELIX: 12.5000 mg | ORAL_SOLUTION | Freq: Four times a day (QID) | ORAL | Status: DC | PRN

## 2018-09-15 MED ORDER — SODIUM CHLORIDE 0.9 % IR SOLN
Status: DC | PRN
  Administered 2018-09-15: 1000 mL via INTRAVESICAL

## 2018-09-15 MED ORDER — STERILE WATER FOR IRRIGATION IR SOLN
Status: DC | PRN
  Administered 2018-09-15: 1000 mL

## 2018-09-15 MED ORDER — DEXAMETHASONE SODIUM PHOSPHATE 10 MG/ML IJ SOLN
INTRAMUSCULAR | Status: DC | PRN
  Administered 2018-09-15: 10 mg via INTRAVENOUS

## 2018-09-15 MED ORDER — KCL IN DEXTROSE-NACL 20-5-0.45 MEQ/L-%-% IV SOLN
INTRAVENOUS | Status: DC
  Administered 2018-09-15 – 2018-09-16 (×3): via INTRAVENOUS
  Filled 2018-09-15 (×3): qty 1000

## 2018-09-15 MED ORDER — PROPOFOL 10 MG/ML IV BOLUS
INTRAVENOUS | Status: DC | PRN
  Administered 2018-09-15: 160 mg via INTRAVENOUS

## 2018-09-15 MED ORDER — FENTANYL CITRATE (PF) 100 MCG/2ML IJ SOLN
INTRAMUSCULAR | Status: AC
  Filled 2018-09-15: qty 2

## 2018-09-15 MED ORDER — DEXAMETHASONE SODIUM PHOSPHATE 10 MG/ML IJ SOLN
INTRAMUSCULAR | Status: AC
  Filled 2018-09-15: qty 1

## 2018-09-15 MED ORDER — ACETAMINOPHEN 500 MG PO TABS
1000.0000 mg | ORAL_TABLET | Freq: Once | ORAL | Status: AC
  Administered 2018-09-15: 1000 mg via ORAL

## 2018-09-15 MED ORDER — BUPIVACAINE-EPINEPHRINE 0.5% -1:200000 IJ SOLN
INTRAMUSCULAR | Status: AC
  Filled 2018-09-15: qty 1

## 2018-09-15 MED ORDER — SUGAMMADEX SODIUM 200 MG/2ML IV SOLN
INTRAVENOUS | Status: AC
  Filled 2018-09-15: qty 2

## 2018-09-15 MED ORDER — TRAMADOL HCL 50 MG PO TABS: 50.0000 mg | ORAL_TABLET | Freq: Four times a day (QID) | ORAL | 0 refills | Status: DC | PRN

## 2018-09-15 MED ORDER — SODIUM CHLORIDE 0.9 % IV BOLUS
1000.0000 mL | Freq: Once | INTRAVENOUS | Status: AC
  Administered 2018-09-15: 1000 mL via INTRAVENOUS

## 2018-09-15 MED ORDER — EPHEDRINE SULFATE-NACL 50-0.9 MG/10ML-% IV SOSY
PREFILLED_SYRINGE | INTRAVENOUS | Status: DC | PRN
  Administered 2018-09-15: 5 mg via INTRAVENOUS
  Administered 2018-09-15: 15 mg via INTRAVENOUS

## 2018-09-15 MED ORDER — HYDROCODONE-ACETAMINOPHEN 7.5-325 MG PO TABS: 1.0000 | ORAL_TABLET | Freq: Once | ORAL | Status: DC | PRN

## 2018-09-15 MED ORDER — SUGAMMADEX SODIUM 200 MG/2ML IV SOLN
INTRAVENOUS | Status: DC | PRN
  Administered 2018-09-15: 200 mg via INTRAVENOUS

## 2018-09-15 MED ORDER — MIDAZOLAM HCL 5 MG/5ML IJ SOLN
INTRAMUSCULAR | Status: DC | PRN
  Administered 2018-09-15: 2 mg via INTRAVENOUS

## 2018-09-15 SURGICAL SUPPLY — 59 items
ADH SKN CLS APL DERMABOND .7 (GAUZE/BANDAGES/DRESSINGS) ×2
APL PRP STRL LF DISP 70% ISPRP (MISCELLANEOUS) ×2
APL SWBSTK 6 STRL LF DISP (MISCELLANEOUS) ×1
APPLICATOR COTTON TIP 6 STRL (MISCELLANEOUS) ×2 IMPLANT
APPLICATOR COTTON TIP 6IN STRL (MISCELLANEOUS) ×3
CATH FOLEY 2WAY SLVR 18FR 30CC (CATHETERS) ×3 IMPLANT
CATH ROBINSON RED A/P 16FR (CATHETERS) ×3 IMPLANT
CATH ROBINSON RED A/P 8FR (CATHETERS) ×3 IMPLANT
CATH TIEMANN FOLEY 18FR 5CC (CATHETERS) ×3 IMPLANT
CHLORAPREP W/TINT 26 (MISCELLANEOUS) ×3 IMPLANT
CLIP VESOLOCK LG 6/CT PURPLE (CLIP) ×6 IMPLANT
COVER SURGICAL LIGHT HANDLE (MISCELLANEOUS) ×3 IMPLANT
COVER TIP SHEARS 8 DVNC (MISCELLANEOUS) ×2 IMPLANT
COVER TIP SHEARS 8MM DA VINCI (MISCELLANEOUS) ×1
COVER WAND RF STERILE (DRAPES) IMPLANT
CUTTER ECHEON FLEX ENDO 45 340 (ENDOMECHANICALS) ×3 IMPLANT
DECANTER SPIKE VIAL GLASS SM (MISCELLANEOUS) ×3 IMPLANT
DERMABOND ADVANCED (GAUZE/BANDAGES/DRESSINGS) ×1
DERMABOND ADVANCED .7 DNX12 (GAUZE/BANDAGES/DRESSINGS) ×2 IMPLANT
DRAPE ARM DVNC X/XI (DISPOSABLE) ×8 IMPLANT
DRAPE COLUMN DVNC XI (DISPOSABLE) ×2 IMPLANT
DRAPE DA VINCI XI ARM (DISPOSABLE) ×4
DRAPE DA VINCI XI COLUMN (DISPOSABLE) ×1
DRAPE SURG IRRIG POUCH 19X23 (DRAPES) ×3 IMPLANT
DRSG TEGADERM 4X4.75 (GAUZE/BANDAGES/DRESSINGS) ×3 IMPLANT
ELECT PENCIL ROCKER SW 15FT (MISCELLANEOUS) ×3 IMPLANT
ELECT REM PT RETURN 15FT ADLT (MISCELLANEOUS) ×3 IMPLANT
GLOVE BIO SURGEON STRL SZ 6.5 (GLOVE) ×3 IMPLANT
GLOVE BIOGEL M STRL SZ7.5 (GLOVE) ×6 IMPLANT
GOWN STRL REUS W/TWL LRG LVL3 (GOWN DISPOSABLE) ×9 IMPLANT
HEMOSTAT SURGICEL 2X3 (HEMOSTASIS) ×3 IMPLANT
HOLDER FOLEY CATH W/STRAP (MISCELLANEOUS) ×3 IMPLANT
IRRIG SUCT STRYKERFLOW 2 WTIP (MISCELLANEOUS) ×3
IRRIGATION SUCT STRKRFLW 2 WTP (MISCELLANEOUS) ×2 IMPLANT
IV LACTATED RINGERS 1000ML (IV SOLUTION) ×3 IMPLANT
KIT TURNOVER KIT A (KITS) ×3 IMPLANT
NDL SAFETY ECLIPSE 18X1.5 (NEEDLE) ×2 IMPLANT
NEEDLE HYPO 18GX1.5 SHARP (NEEDLE) ×2
PACK ROBOT UROLOGY CUSTOM (CUSTOM PROCEDURE TRAY) ×3 IMPLANT
SEAL CANN UNIV 5-8 DVNC XI (MISCELLANEOUS) ×8 IMPLANT
SEAL XI 5MM-8MM UNIVERSAL (MISCELLANEOUS) ×4
SOLUTION ELECTROLUBE (MISCELLANEOUS) ×3 IMPLANT
STAPLE RELOAD 45 GRN (STAPLE) ×2 IMPLANT
STAPLE RELOAD 45MM GREEN (STAPLE) ×3
SUT ETHILON 3 0 PS 1 (SUTURE) ×3 IMPLANT
SUT MNCRL 3 0 RB1 (SUTURE) ×2 IMPLANT
SUT MNCRL 3 0 VIOLET RB1 (SUTURE) ×2 IMPLANT
SUT MNCRL AB 4-0 PS2 18 (SUTURE) ×6 IMPLANT
SUT MONOCRYL 3 0 RB1 (SUTURE) ×2
SUT VIC AB 0 CT1 27 (SUTURE) ×3
SUT VIC AB 0 CT1 27XBRD ANTBC (SUTURE) ×2 IMPLANT
SUT VIC AB 0 UR5 27 (SUTURE) ×3 IMPLANT
SUT VIC AB 2-0 SH 27 (SUTURE) ×3
SUT VIC AB 2-0 SH 27X BRD (SUTURE) ×2 IMPLANT
SUT VICRYL 0 UR6 27IN ABS (SUTURE) ×6 IMPLANT
SYR 27GX1/2 1ML LL SAFETY (SYRINGE) ×3 IMPLANT
TOWEL OR 17X26 10 PK STRL BLUE (TOWEL DISPOSABLE) ×3 IMPLANT
TOWEL OR NON WOVEN STRL DISP B (DISPOSABLE) ×3 IMPLANT
WATER STERILE IRR 1000ML POUR (IV SOLUTION) ×6 IMPLANT

## 2018-09-15 NOTE — Op Note (Signed)

## 2018-09-15 NOTE — Discharge Instructions (Signed)

## 2018-09-15 NOTE — Progress Notes (Signed)
Patient ID: Christopher Contreras, male   DOB: 1956-05-15, 63 y.o.   MRN: [id number]  Post-op note  Subjective: The patient is doing well.  Complains of bladder spasms.  Objective: Vital signs in last 24 hours: Temp:  [97.7 F (36.5 C)-98.3 F (36.8 C)] 97.7 F (36.5 C) (04/20 1345) Pulse Rate:  [84-96] 91 (04/20 1415) Resp:  [15-18] 18 (04/20 1415) BP: (128-136)/(63-72) 134/71 (04/20 1415) SpO2:  [97 %-100 %] 100 % (04/20 1415) Weight:  [88.2 kg] 88.2 kg (04/20 1024)  Intake/Output from previous day: No intake/output data recorded. Intake/Output this shift: Total I/O In: 1176 [I.V.:1076; IV Piggyback:100] Out: 100 [Blood:100]  Physical Exam:  General: Alert and oriented. Abdomen: Soft, Nondistended. Incisions: Clean and dry.  Lab Results: Recent Labs    09/15/18 1419  HGB 14.3  HCT 40.9    Assessment/Plan: POD#0   1) Continue to monitor, B&O suppository 2) Ambulate and IS today   Christopher Contreras. MD   LOS: 0 days   Dutch Gray 09/15/2018, 2:29 PM

## 2018-09-15 NOTE — Transfer of Care (Signed)
Immediate Anesthesia Transfer of Care Note  Patient: Christopher Contreras  Procedure(s) Performed: XI ROBOTIC ASSISTED LAPAROSCOPIC RADICAL PROSTATECTOMY LEVEL 2 (N/A ) LYMPHADENECTOMY, PELVIC (Bilateral )  Patient Location: PACU  Anesthesia Type:General  Level of Consciousness: awake, oriented and patient cooperative  Airway & Oxygen Therapy: Patient Spontanous Breathing and Patient connected to face mask  Post-op Assessment: Report given to RN, Post -op Vital signs reviewed and stable and Patient moving all extremities X 4  Post vital signs: Reviewed and stable  Last Vitals:  Vitals Value Taken Time  BP 128/67 09/15/2018  1:45 PM  Temp    Pulse 95 09/15/2018  1:49 PM  Resp 14 09/15/2018  1:49 PM  SpO2 100 % 09/15/2018  1:49 PM  Vitals shown include unvalidated device data.  Last Pain:  Vitals:   09/15/18 1024  TempSrc:   PainSc: 0-No pain         Complications: No apparent anesthesia complications

## 2018-09-15 NOTE — Progress Notes (Signed)
Reviewed plan of care with patient - prostatectomy educational packet provided as patient was unable to attend class before surgery

## 2018-09-15 NOTE — Anesthesia Postprocedure Evaluation (Signed)
Anesthesia Post Note  Patient: Christopher Contreras  Procedure(s) Performed: XI ROBOTIC ASSISTED LAPAROSCOPIC RADICAL PROSTATECTOMY LEVEL 2 (N/A ) LYMPHADENECTOMY, PELVIC (Bilateral )     Patient location during evaluation: PACU Anesthesia Type: General Level of consciousness: awake and alert Pain management: pain level controlled Vital Signs Assessment: post-procedure vital signs reviewed and stable Respiratory status: spontaneous breathing, nonlabored ventilation, respiratory function stable and patient connected to nasal cannula oxygen Cardiovascular status: blood pressure returned to baseline and stable Postop Assessment: no apparent nausea or vomiting Anesthetic complications: no    Last Vitals:  Vitals:   09/15/18 1445 09/15/18 1503  BP: 136/74 134/73  Pulse: 86 77  Resp: 16 15  Temp: 36.5 C 36.6 C  SpO2: 98% 98%    Last Pain:  Vitals:   09/15/18 1503  TempSrc: Oral  PainSc:                  Barnet Glasgow

## 2018-09-15 NOTE — Anesthesia Procedure Notes (Addendum)
Procedure Name: Intubation Date/Time: 09/15/2018 11:10 AM Performed by: Silas Sacramento, CRNA Pre-anesthesia Checklist: Patient identified, Emergency Drugs available, Suction available and Patient being monitored Patient Re-evaluated:Patient Re-evaluated prior to induction Oxygen Delivery Method: Circle system utilized Preoxygenation: Pre-oxygenation with 100% oxygen Induction Type: IV induction and Rapid sequence Ventilation: Mask ventilation without difficulty Laryngoscope Size: Mac and 4 Grade View: Grade I Tube type: Oral Tube size: 7.5 mm Number of attempts: 1 Airway Equipment and Method: Stylet Placement Confirmation: ETT inserted through vocal cords under direct vision,  positive ETCO2 and breath sounds checked- equal and bilateral Secured at: 23 cm Tube secured with: Tape Dental Injury: Teeth and Oropharynx as per pre-operative assessment

## 2018-09-16 ENCOUNTER — Encounter (HOSPITAL_COMMUNITY): Payer: Self-pay | Admitting: Urology

## 2018-09-16 DIAGNOSIS — Z8042 Family history of malignant neoplasm of prostate: Secondary | ICD-10-CM | POA: Diagnosis not present

## 2018-09-16 DIAGNOSIS — Z882 Allergy status to sulfonamides status: Secondary | ICD-10-CM | POA: Diagnosis not present

## 2018-09-16 DIAGNOSIS — C61 Malignant neoplasm of prostate: Secondary | ICD-10-CM | POA: Diagnosis not present

## 2018-09-16 LAB — HEMOGLOBIN AND HEMATOCRIT, BLOOD
HCT: 40.2 % (ref 39.0–52.0)
Hemoglobin: 14 g/dL (ref 13.0–17.0)

## 2018-09-16 MED ORDER — BISACODYL 10 MG RE SUPP
10.0000 mg | Freq: Once | RECTAL | Status: AC
  Administered 2018-09-16: 10 mg via RECTAL
  Filled 2018-09-16: qty 1

## 2018-09-16 MED ORDER — TRAMADOL HCL 50 MG PO TABS
50.0000 mg | ORAL_TABLET | Freq: Four times a day (QID) | ORAL | Status: DC | PRN
  Administered 2018-09-16: 50 mg via ORAL
  Filled 2018-09-16: qty 1

## 2018-09-16 NOTE — Progress Notes (Signed)
Discharge, leg bag, large urinary bag and medication instructions reviewed with patient. Patient was able to demonstrate leg bag and large bag care as well as Foley care. Questions answered and patient denies further questions. No prescriptions given to patient. Spouse is here to drive patient home. Donne Hazel, RN

## 2018-09-16 NOTE — Progress Notes (Signed)
Patient ID: Christopher Contreras, male   DOB: Aug 07, 1955, 63 y.o.   MRN: [id number]  1 Day Post-Op Subjective: The patient is doing well.  No nausea or vomiting. Pain is adequately controlled.  Objective: Vital signs in last 24 hours: Temp:  [97.4 F (36.3 C)-98.3 F (36.8 C)] 98.2 F (36.8 C) (04/21 0658) Pulse Rate:  [55-99] 64 (04/21 0659) Resp:  [15-19] 16 (04/21 0658) BP: (96-136)/(53-76) 103/60 (04/21 0659) SpO2:  [94 %-100 %] 97 % (04/21 0659) Weight:  [88.2 kg] 88.2 kg (04/20 1024)  Intake/Output from previous day: 04/20 0701 - 04/21 0700 In: 5516.6 [P.O.:1100; I.V.:3266.6; IV Piggyback:1150] Out: 2585 [Urine:4375; Drains:140; Blood:100] Intake/Output this shift: No intake/output data recorded.  Physical Exam:  General: Alert and oriented. CV: RRR Lungs: Clear bilaterally. GI: Soft, Nondistended. Incisions: Clean, dry, and intact Urine: Clear Extremities: Nontender, no erythema, no edema.  Lab Results: Recent Labs    09/15/18 1419 09/16/18 0538  HGB 14.3 14.0  HCT 40.9 40.2      Assessment/Plan: POD# 1 s/p robotic prostatectomy.  1) SL IVF 2) Ambulate, Incentive spirometry 3) Transition to oral pain medication 4) Dulcolax suppository 5) D/C pelvic drain 6) Plan for likely discharge later today   Pryor Curia. MD   LOS: 0 days   Dutch Gray 09/16/2018, 7:11 AM

## 2018-09-16 NOTE — Discharge Summary (Signed)
  Date of admission: 09/15/2018  Date of discharge: 09/16/2018  Admission diagnosis: Prostate Cancer  Discharge diagnosis: Prostate Cancer  History and Physical: For full details, please see admission history and physical. Briefly, Christopher Contreras is a 63 y.o. gentleman with localized prostate cancer.  After discussing management/treatment options, he elected to proceed with surgical treatment.  Hospital Course: PINKNEY VENARD was taken to the operating room on 09/15/2018 and underwent a robotic assisted laparoscopic radical prostatectomy. He tolerated this procedure well and without complications. Postoperatively, he was able to be transferred to a regular hospital room following recovery from anesthesia.  He was able to begin ambulating the night of surgery. He remained hemodynamically stable overnight.  He had excellent urine output with appropriately minimal output from his pelvic drain and his pelvic drain was removed on POD #1.  He was transitioned to oral pain medication, tolerated a clear liquid diet, and had met all discharge criteria and was able to be discharged home later on POD#1.  Laboratory values:  Recent Labs    09/15/18 1419 09/16/18 0538  HGB 14.3 14.0  HCT 40.9 40.2    Disposition: Home  Discharge instruction: He was instructed to be ambulatory but to refrain from heavy lifting, strenuous activity, or driving. He was instructed on urethral catheter care.  Discharge medications:   Allergies as of 09/16/2018      Reactions   Sulfonamide Derivatives    Unknown, childhood allergy      Medication List    STOP taking these medications   sildenafil 100 MG tablet Commonly known as:  Viagra     TAKE these medications   ciprofloxacin 500 MG tablet Commonly known as:  Cipro Take 1 tablet (500 mg total) by mouth 2 (two) times daily. Start day prior to office visit for foley removal   traMADol 50 MG tablet Commonly known as:  Ultram Take 1-2 tablets (50-100 mg  total) by mouth every 6 (six) hours as needed for moderate pain or severe pain.   triamcinolone cream 0.1 % Commonly known as:  KENALOG Apply 1 application topically 2 (two) times daily as needed.       Followup: He will followup in 1 week for catheter removal and to discuss his surgical pathology results.

## 2018-09-24 ENCOUNTER — Encounter: Payer: Self-pay | Admitting: Family Medicine

## 2018-10-15 DIAGNOSIS — M62838 Other muscle spasm: Secondary | ICD-10-CM | POA: Diagnosis not present

## 2018-10-15 DIAGNOSIS — M6281 Muscle weakness (generalized): Secondary | ICD-10-CM | POA: Diagnosis not present

## 2018-10-15 DIAGNOSIS — N393 Stress incontinence (female) (male): Secondary | ICD-10-CM | POA: Diagnosis not present

## 2018-10-31 DIAGNOSIS — M6281 Muscle weakness (generalized): Secondary | ICD-10-CM | POA: Diagnosis not present

## 2018-10-31 DIAGNOSIS — N393 Stress incontinence (female) (male): Secondary | ICD-10-CM | POA: Diagnosis not present

## 2018-10-31 DIAGNOSIS — M62838 Other muscle spasm: Secondary | ICD-10-CM | POA: Diagnosis not present

## 2018-12-17 DIAGNOSIS — C61 Malignant neoplasm of prostate: Secondary | ICD-10-CM | POA: Diagnosis not present

## 2018-12-26 DIAGNOSIS — N393 Stress incontinence (female) (male): Secondary | ICD-10-CM | POA: Diagnosis not present

## 2018-12-26 DIAGNOSIS — C61 Malignant neoplasm of prostate: Secondary | ICD-10-CM | POA: Diagnosis not present

## 2018-12-26 DIAGNOSIS — N5201 Erectile dysfunction due to arterial insufficiency: Secondary | ICD-10-CM | POA: Diagnosis not present

## 2019-05-26 DIAGNOSIS — Z20828 Contact with and (suspected) exposure to other viral communicable diseases: Secondary | ICD-10-CM | POA: Diagnosis not present

## 2019-05-26 DIAGNOSIS — G4489 Other headache syndrome: Secondary | ICD-10-CM | POA: Diagnosis not present

## 2019-06-17 DIAGNOSIS — C61 Malignant neoplasm of prostate: Secondary | ICD-10-CM | POA: Diagnosis not present

## 2019-06-24 DIAGNOSIS — N5201 Erectile dysfunction due to arterial insufficiency: Secondary | ICD-10-CM | POA: Diagnosis not present

## 2019-06-24 DIAGNOSIS — C61 Malignant neoplasm of prostate: Secondary | ICD-10-CM | POA: Diagnosis not present

## 2019-08-10 ENCOUNTER — Emergency Department
Admission: EM | Admit: 2019-08-10 | Discharge: 2019-08-10 | Disposition: A | Discharge status: Home / Self Care | Payer: BC Managed Care – PPO | Attending: Emergency Medicine | Admitting: Emergency Medicine

## 2019-08-10 ENCOUNTER — Other Ambulatory Visit: Payer: Self-pay

## 2019-08-10 ENCOUNTER — Encounter: Payer: Self-pay | Admitting: Emergency Medicine

## 2019-08-10 DIAGNOSIS — E860 Dehydration: Secondary | ICD-10-CM | POA: Diagnosis not present

## 2019-08-10 DIAGNOSIS — Z8546 Personal history of malignant neoplasm of prostate: Secondary | ICD-10-CM | POA: Diagnosis not present

## 2019-08-10 DIAGNOSIS — R42 Dizziness and giddiness: Secondary | ICD-10-CM | POA: Diagnosis not present

## 2019-08-10 DIAGNOSIS — Z85828 Personal history of other malignant neoplasm of skin: Secondary | ICD-10-CM | POA: Diagnosis not present

## 2019-08-10 LAB — CBC
HCT: 45.4 % (ref 39.0–52.0)
Hemoglobin: 15.8 g/dL (ref 13.0–17.0)
MCH: 30.9 pg (ref 26.0–34.0)
MCHC: 34.8 g/dL (ref 30.0–36.0)
MCV: 88.7 fL (ref 80.0–100.0)
Platelets: 194 10*3/uL (ref 150–400)
RBC: 5.12 MIL/uL (ref 4.22–5.81)
RDW: 12.9 % (ref 11.5–15.5)
WBC: 5.8 10*3/uL (ref 4.0–10.5)
nRBC: 0 % (ref 0.0–0.2)

## 2019-08-10 LAB — BASIC METABOLIC PANEL
Anion gap: 10 (ref 5–15)
BUN: 14 mg/dL (ref 8–23)
CO2: 26 mmol/L (ref 22–32)
Calcium: 8.9 mg/dL (ref 8.9–10.3)
Chloride: 103 mmol/L (ref 98–111)
Creatinine, Ser: 1.22 mg/dL (ref 0.61–1.24)
GFR calc Af Amer: >60 mL/min (ref >60)
GFR calc non Af Amer: >60 mL/min (ref >60)
Glucose, Bld: 107 mg/dL — ABNORMAL HIGH (ref 70–99)
Potassium: 3.7 mmol/L (ref 3.5–5.1)
Sodium: 139 mmol/L (ref 135–145)

## 2019-08-10 LAB — TROPONIN I (HIGH SENSITIVITY)
Troponin I (High Sensitivity): 4 ng/L (ref <18)
Troponin I (High Sensitivity): 5 ng/L (ref <18)

## 2019-08-10 NOTE — ED Provider Notes (Signed)
[facility] Emergency Department Provider Note  ____________________________________________  Time seen: Approximately 1:49 PM  I have reviewed the triage vital signs and the nursing notes.   HISTORY  Chief Complaint Dizziness    HPI Christopher Contreras is a 64 y.o. male with a history of hyperlipidemia,  prostate cancer, left inner ear dysfunction who comes the ED complaining of nausea and lightheadedness that started while driving to work this morning.  Patient reports that he was in his usual state of health last night, did not eat dinner because he was not hungry.  He felt thirsty which is unusual for him so he drank a sports drink before bed.  Normally he avoids drinking fluids after 7 PM due to prostate disease and urinary frequency to try to avoid having to get up multiple times throughout the night to go to the bathroom.  He did not have to urinate overnight, and this morning when he got up he still did not have a significant amount of urine to void.  Denies chest pain or shortness of breath, no exertional symptoms.  No fevers or chills.  Did not eat breakfast this morning and only had an energy drink.  Patient believes his symptoms particularly with decreased urine output and thirst last night are consistent with dehydration due to not eating dinner.     Past Medical History:  Diagnosis Date  . Anxiety   . Basal cell carcinoma of face   . Disorders of bilirubin excretion   . Diverticulosis of colon 11/02  . Family history of adverse reaction to anesthesia    mother and father diffucult to arouse   . Hepatitis A was in 9th grade  . HLD (hyperlipidemia) 2/08  . Personal history of other infectious and parasitic disease    reports this as a hepatitis A infection in junior HS , denies any liver disorder   . Prostate cancer ([place])   . Pseudocholinesterase deficiency    patient reports this runs in his family ; but is unsure if he has the trait; see  anethesia history for family history   . Vestibular disorder, left    ear ; injury occurred in the [place]      Patient Active Problem List   Diagnosis Date Noted  . Prostate cancer ([place]) 07/09/2018  . Contact dermatitis 01/10/2018  . Throat pain in adult 02/13/2017  . Erectile dysfunction 02/13/2017  . BPH (benign prostatic hyperplasia) 11/12/2016  . Fatigue 07/20/2016  . Abdominal discomfort, epigastric 07/20/2016  . Tendonitis of right hip 10/06/2013  . ANXIETY 12/27/2008  . HLD (hyperlipidemia) 03/17/2008  . GILBERT'S SYNDROME 03/17/2008  . DIVERTICULOSIS, COLON 03/17/2008  . HEPATITIS, HX OF 03/17/2008     Past Surgical History:  Procedure Laterality Date  . BASAL CELL CARCINOMA EXCISION  1/99   right face  . CATARACT EXTRACTION, BILATERAL  about 2-4 years ago   with bilateral lens placement  . COLONOSCOPY  03/29/01   Diverticulosis  . COLONOSCOPY  02/15/10   Divertics-Dr. Henrene Pastor  . LYMPHADENECTOMY Bilateral 09/15/2018   Procedure: LYMPHADENECTOMY, PELVIC;  Surgeon: Raynelle Bring, MD;  Location: [facility] ORS;  Service: Urology;  Laterality: Bilateral;  . ROBOT ASSISTED LAPAROSCOPIC RADICAL PROSTATECTOMY N/A 09/15/2018   Procedure: XI ROBOTIC ASSISTED LAPAROSCOPIC RADICAL PROSTATECTOMY LEVEL 2;  Raynelle Bring, MD)  . VASECTOMY  pre 1999   Dr. Rogers Blocker     Prior to Admission medications   Medication Sig Start Date End Date Taking? Authorizing Provider  ciprofloxacin (CIPRO)  500 MG tablet Take 1 tablet (500 mg total) by mouth 2 (two) times daily. Start day prior to office visit for foley removal 09/15/18   Dancy, Estill Bamberg, PA-C  traMADol (ULTRAM) 50 MG tablet Take 1-2 tablets (50-100 mg total) by mouth every 6 (six) hours as needed for moderate pain or severe pain. 09/15/18   Debbrah Alar, PA-C  triamcinolone cream (KENALOG) 0.1 % Apply 1 application topically 2 (two) times daily as needed. Patient not taking: Reported on 06/16/2018 01/10/18   Venia Carbon, MD      Allergies Sulfonamide derivatives   Family History  Problem Relation Age of Onset  . Prostate cancer Father        s/p prostatectomy  . Other Father        Back pain with neuropathy  . Colon cancer Mother 65  . Colon polyps Sister   . Colon polyps Sister   . Heart attack Other        Maternal side  . Hypertension Other        Both sides  . Diabetes Son   . Diabetes Paternal Grandfather   . Bone cancer Maternal Grandmother     Social History Social History   Tobacco Use  . Smoking status: Never Smoker  . Smokeless tobacco: Never Used  Substance Use Topics  . Alcohol use: Yes    Comment: Rare  . Drug use: No    Review of Systems  Constitutional:   No fever or chills.  ENT:   No sore throat. No rhinorrhea. Cardiovascular:   No chest pain or syncope. Respiratory:   No dyspnea or cough. Gastrointestinal:   Negative for abdominal pain, vomiting and diarrhea.  Musculoskeletal:   Negative for focal pain or swelling All other systems reviewed and are negative except as documented above in ROS and HPI.  ____________________________________________   PHYSICAL EXAM:  VITAL SIGNS: ED Triage Vitals  Enc Vitals Group     BP 08/10/19 0823 (!) 141/85     Pulse Rate 08/10/19 0823 95     Resp 08/10/19 0823 18     Temp 08/10/19 0823 98.4 F (36.9 C)     Temp Source 08/10/19 0823 Oral     SpO2 08/10/19 0823 97 %     Weight 08/10/19 0823 200 lb (90.7 kg)     Height 08/10/19 0823 5\' 8"  (1.727 m)     Head Circumference --      Peak Flow --      Pain Score 08/10/19 0830 0     Pain Loc --      Pain Edu? --      Excl. in [place]? --     Vital signs reviewed, nursing assessments reviewed.   Constitutional:   Alert and oriented. Non-toxic appearance. Eyes:   Conjunctivae are normal. EOMI. PERRL. ENT      Head:   Normocephalic and atraumatic.      Nose:   Wearing a mask.      Mouth/Throat:   Wearing a mask.      Neck:   No meningismus. Full  ROM. Hematological/Lymphatic/Immunilogical:   No cervical lymphadenopathy. Cardiovascular:   RRR. Symmetric bilateral radial and DP pulses.  No murmurs. Cap refill less than 2 seconds. Respiratory:   Normal respiratory effort without tachypnea/retractions. Breath sounds are clear and equal bilaterally. No wheezes/rales/rhonchi. Gastrointestinal:   Soft and nontender. Non distended. There is no CVA tenderness.  No rebound, rigidity, or guarding. Musculoskeletal:   Normal range of  motion in all extremities. No joint effusions.  No lower extremity tenderness.  No edema. Neurologic:   Normal speech and language.  Motor grossly intact. No acute focal neurologic deficits are appreciated.  Skin:    Skin is warm, dry and intact. No rash noted.  No petechiae, purpura, or bullae.  ____________________________________________    LABS (pertinent positives/negatives) (all labs ordered are listed, but only abnormal results are displayed) Labs Reviewed  BASIC METABOLIC PANEL - Abnormal; Notable for the following components:      Result Value   Glucose, Bld 107 (*)    All other components within normal limits  CBC  TROPONIN I (HIGH SENSITIVITY)  TROPONIN I (HIGH SENSITIVITY)   ____________________________________________   EKG  Interpreted by me Normal sinus rhythm rate of 87, left axis, normal intervals.  Poor R wave progression.  Normal ST segments and T waves.  ____________________________________________    RADIOLOGY  No results found.  ____________________________________________   PROCEDURES Procedures  ____________________________________________    CLINICAL IMPRESSION / ASSESSMENT AND PLAN / ED COURSE  Medications ordered in the ED: Medications - No data to display  Pertinent labs & imaging results that were available during my care of the patient were reviewed by me and considered in my medical decision making (see chart for details).  Christopher Contreras was  evaluated in Emergency Department on 08/10/2019 for the symptoms described in the history of present illness. He was evaluated in the context of the global COVID-19 pandemic, which necessitated consideration that the patient might be at risk for infection with the SARS-CoV-2 virus that causes COVID-19. Institutional protocols and algorithms that pertain to the evaluation of patients at risk for COVID-19 are in a state of rapid change based on information released by regulatory bodies including the CDC and federal and state organizations. These policies and algorithms were followed during the patient's care in the ED.   Patient presents with episode of lightheadedness, nausea.  Vital signs are normal, not hypoglycemic.  Lab panel unremarkable, EKG nonischemic.  Second troponin also normal.  Exam is benign and reassuring.  By history is symptoms are consistent with dehydration due to skipped meals.  He has had several bottles of water to drink while in the ED waiting room and feeling better.  Work-up is reassuring.  He stable for discharge home to follow-up with primary care in a week for blood pressure recheck.      ____________________________________________   FINAL CLINICAL IMPRESSION(S) / ED DIAGNOSES    Final diagnoses:  Dizziness  Dehydration     ED Discharge Orders    None      Portions of this note were generated with dragon dictation software. Dictation errors may occur despite best attempts at proofreading.   Carrie Mew, MD 08/10/19 1359

## 2019-08-10 NOTE — ED Triage Notes (Signed)
Says was driving to work and had onset nausea and lightheaded.  Says his hands and feet were sweating.ems did fsbs andit was okay.  He says he also has hisory of inner ear probs.

## 2019-08-10 NOTE — Discharge Instructions (Addendum)
Your EKG and labs today were all okay.  Focus on eating regularly and staying well-hydrated and follow-up with your doctor in about a week for blood pressure recheck.

## 2019-08-13 ENCOUNTER — Ambulatory Visit (INDEPENDENT_AMBULATORY_CARE_PROVIDER_SITE_OTHER)
Admission: RE | Admit: 2019-08-13 | Discharge: 2019-08-13 | Disposition: A | Discharge status: Home / Self Care | Payer: BC Managed Care – PPO | Source: Ambulatory Visit | Attending: Family Medicine | Admitting: Family Medicine

## 2019-08-13 ENCOUNTER — Ambulatory Visit (INDEPENDENT_AMBULATORY_CARE_PROVIDER_SITE_OTHER): Payer: BC Managed Care – PPO | Admitting: Family Medicine

## 2019-08-13 ENCOUNTER — Encounter: Payer: Self-pay | Admitting: Family Medicine

## 2019-08-13 ENCOUNTER — Other Ambulatory Visit: Payer: Self-pay

## 2019-08-13 VITALS — BP 124/70 | HR 83 | Temp 97.8°F | Ht 68.0 in | Wt 207.1 lb

## 2019-08-13 DIAGNOSIS — C61 Malignant neoplasm of prostate: Secondary | ICD-10-CM

## 2019-08-13 DIAGNOSIS — E785 Hyperlipidemia, unspecified: Secondary | ICD-10-CM | POA: Diagnosis not present

## 2019-08-13 DIAGNOSIS — R0602 Shortness of breath: Secondary | ICD-10-CM | POA: Diagnosis not present

## 2019-08-13 DIAGNOSIS — R0600 Dyspnea, unspecified: Secondary | ICD-10-CM | POA: Diagnosis not present

## 2019-08-13 DIAGNOSIS — H832X2 Labyrinthine dysfunction, left ear: Secondary | ICD-10-CM

## 2019-08-13 DIAGNOSIS — R12 Heartburn: Secondary | ICD-10-CM

## 2019-08-13 DIAGNOSIS — E860 Dehydration: Secondary | ICD-10-CM

## 2019-08-13 LAB — HEPATIC FUNCTION PANEL
ALT: 45 U/L (ref 0–53)
AST: 27 U/L (ref 0–37)
Albumin: 4.5 g/dL (ref 3.5–5.2)
Alkaline Phosphatase: 48 U/L (ref 39–117)
Bilirubin, Direct: 0.3 mg/dL (ref 0.0–0.3)
Total Bilirubin: 2.5 mg/dL — ABNORMAL HIGH (ref 0.2–1.2)
Total Protein: 7.1 g/dL (ref 6.0–8.3)

## 2019-08-13 LAB — LIPID PANEL
Cholesterol: 204 mg/dL — ABNORMAL HIGH (ref 0–200)
HDL: 44.2 mg/dL (ref >39.00)
LDL Cholesterol: 145 mg/dL — ABNORMAL HIGH (ref 0–99)
NonHDL: 159.73
Total CHOL/HDL Ratio: 5
Triglycerides: 74 mg/dL (ref 0.0–149.0)
VLDL: 14.8 mg/dL (ref 0.0–40.0)

## 2019-08-13 LAB — SARS-COV-2 IGG: SARS-COV-2 IgG: 5.3

## 2019-08-13 NOTE — Assessment & Plan Note (Signed)
S/p prostatectomy last year with organ confined disease, has recovered well. Sees uro regularly

## 2019-08-13 NOTE — Patient Instructions (Addendum)
Labs today Chest xray today Call GI to schedule colonoscopy.  Good to see you today. Call us with questions. Return as needed or in 1 year for next physical.

## 2019-08-13 NOTE — Progress Notes (Signed)
This visit was conducted in person.  BP 124/70 (BP Location: Left Arm, Patient Position: Sitting, Cuff Size: Large)   Pulse 83   Temp 97.8 F (36.6 C) (Temporal)   Ht 5\' 8"  (1.727 m)   Wt 207 lb 1 oz (93.9 kg)   SpO2 97%   BMI 31.48 kg/m   BP Readings from Last 3 Encounters:  08/13/19 124/70  08/10/19 (!) 152/98  09/16/18 (!) 106/55    CC: ER f/u visit Subjective:    Patient ID: Christopher Contreras, male    DOB: 02/11/1956, 64 y.o.   MRN: [id number]  HPI: Christopher Contreras is a 64 y.o. male presenting on 08/13/2019 for Hospitalization Follow-up (Seen at [facility] ED on 08/10/19. )   Recent evaluation at ER for nausea, dizzy/lightheadedness, thought to be due to dehydration. Had skipped dinner and breakfast, had only had an energy drink that morning. Underwent reassuring evaluation including EKG (only noted poor R wave progression), cardiac enzymes, stable labs.  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  Records reviewed.   He has been drinking Verve energy drink in the morning - with caffeine.   S/p robot assisted laparoscopic radical prostatectomy for known prostate cancer [facility]) 08/2018 - organ confined disease, negative LNs. Continues seeing uro.   COLONOSCOPY 03/29/01 - Diverticulosis  COLONOSCOPY 02/15/10 - Divertics-Dr. Henrene Pastor  Mother with h/o colon cancer  Sisters with polyps Due for colonoscopy - will call to schedule.   No recent CPE - he is fasting today.  Covid vaccine - interested shingrix - discussed.  He thinks he had covid the first several weeks of January - sick exposures at work (5-6 ppl). He tested negative with rapid test. Didn't feel well for 3 wks, no fever but he did have loss of taste and smell (which persists). Notes increased dyspnea since this as well. Notes increasing leg cramps - hasn't been drinking as much water as he could.   Notes increase in heartburn - managing with tums PRN and prilosec OTC  PRN.   CHF in family - father and maternal grandfather  H/o L inner ear damage - notes some intermittent dizziness over last few days - remote trouble since 1984 from [employer].      Relevant past medical, surgical, family and social history reviewed and updated as indicated. Interim medical history since our last visit reviewed. Allergies and medications reviewed and updated. Outpatient Medications Prior to Visit  Medication Sig Dispense Refill  . ciprofloxacin (CIPRO) 500 MG tablet Take 1 tablet (500 mg total) by mouth 2 (two) times daily. Start day prior to office visit for foley removal (Patient not taking: Reported on 08/10/2019) 6 tablet 0  . traMADol (ULTRAM) 50 MG tablet Take 1-2 tablets (50-100 mg total) by mouth every 6 (six) hours as needed for moderate pain or severe pain. (Patient not taking: Reported on 08/10/2019) 20 tablet 0  . triamcinolone cream (KENALOG) 0.1 % Apply 1 application topically 2 (two) times daily as needed. (Patient not taking: Reported on 06/16/2018) 45 g 1   No facility-administered medications prior to visit.     Per HPI unless specifically indicated in ROS section below Review of Systems Objective:    BP 124/70 (BP Location: Left Arm, Patient Position: Sitting, Cuff Size: Large)   Pulse 83   Temp 97.8 F (36.6 C) (Temporal)   Ht 5\' 8"  (1.727 m)   Wt 207 lb 1 oz (93.9  kg)   SpO2 97%   BMI 31.48 kg/m   Wt Readings from Last 3 Encounters:  08/13/19 207 lb 1 oz (93.9 kg)  08/10/19 200 lb (90.7 kg)  09/15/18 194 lb 8 oz (88.2 kg)    Physical Exam Vitals and nursing note reviewed.  Constitutional:      Appearance: Normal appearance. He is not ill-appearing.  Eyes:     Extraocular Movements: Extraocular movements intact.     Pupils: Pupils are equal, round, and reactive to light.  Cardiovascular:     Rate and Rhythm: Normal rate and regular rhythm.     Pulses: Normal pulses.     Heart sounds: Normal heart sounds. No murmur.  Pulmonary:       Effort: Pulmonary effort is normal. No respiratory distress.     Breath sounds: Normal breath sounds. No wheezing, rhonchi or rales.  Musculoskeletal:     Cervical back: Normal range of motion and neck supple.     Right lower leg: No edema.     Left lower leg: No edema.  Skin:    Findings: No rash.  Neurological:     Mental Status: He is alert.  Psychiatric:        Mood and Affect: Mood normal.        Behavior: Behavior normal.       Results for orders placed or performed in visit on 08/13/19  Lipid panel  Result Value Ref Range   Cholesterol 204 (H) 0 - 200 mg/dL   Triglycerides 74.0 0.0 - 149.0 mg/dL   HDL 44.20 >39.00 mg/dL   VLDL 14.8 0.0 - 40.0 mg/dL   LDL Cholesterol 145 (H) 0 - 99 mg/dL   Total CHOL/HDL Ratio 5    NonHDL 159.73   Hepatic function panel  Result Value Ref Range   Total Bilirubin 2.5 (H) 0.2 - 1.2 mg/dL   Bilirubin, Direct 0.3 0.0 - 0.3 mg/dL   Alkaline Phosphatase 48 39 - 117 U/L   AST 27 0 - 37 U/L   ALT 45 0 - 53 U/L   Total Protein 7.1 6.0 - 8.3 g/dL   Albumin 4.5 3.5 - 5.2 g/dL  SARS-COV-2 IgG  Result Value Ref Range   SARS-COV-2 IgG 5.30 Reactive Non-Reactive   Assessment & Plan:  This visit occurred during the SARS-CoV-2 public health emergency.  Safety protocols were in place, including screening questions prior to the visit, additional usage of staff PPE, and extensive cleaning of exam room while observing appropriate contact time as indicated for disinfecting solutions.  We reviewed recommended vaccinations in setting of covid vaccine as well as encouraged he call to schedule colonoscopy as due.  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       Shortness of breath - Primary    Residual dyspnea along with loss of taste and smell since acute illness 05/2019- tested negative for covid-19 at that time but  persistent suspicion for this - will check IgG antibody titers to SARS-CoV2. Check CXR today as well. Good O2 sats and normal exam is reassuring.       Relevant Orders   SARS-COV-2 IgG (Completed)   DG Chest 2 View (Completed)   Prostate cancer [facility])    S/p prostatectomy last year with organ confined disease, has recovered well. Sees uro regularly      HLD (hyperlipidemia)  Update FLP as he's fasting today.  The 10-year ASCVD risk score Mikey Bussing DC Brooke Bonito., et al., 2013) is: 9.8%   Values used to calculate the score:     Age: 42 years     Sex: Male     Is Non-Hispanic African American: No     Diabetic: No     Tobacco smoker: No     Systolic Blood Pressure: A999333 mmHg     Is BP treated: No     HDL Cholesterol: 44.2 mg/dL     Total Cholesterol: 204 mg/dL       Relevant Orders   Lipid panel (Completed)   Hepatic function panel (Completed)   Heartburn    Ok to continue prilosec OTC with tums PRN.       Gilbert syndrome    Carries this diagnosis - update LFTs      Dehydration    Recent ER evaluation with diagnosis of dehydration. labwork reassuring including cycled cardiac enzymes as was EKG. Reviewed limiting caffeine (ie energy drink) and increasing water intake to prevent future dehydration.           No orders of the defined types were placed in this encounter.  Orders Placed This Encounter  Procedures  . DG Chest 2 View    Standing Status:   Future    Number of Occurrences:   1    Standing Expiration Date:   10/12/2020    Order Specific Question:   Reason for Exam (SYMPTOM  OR DIAGNOSIS REQUIRED)    Answer:   dyspnea    Order Specific Question:   Preferred imaging location?    Answer:   [facility]    Order Specific Question:   Radiology Contrast Protocol - do NOT remove file path    Answer:   [email or web address]  . Lipid panel  . Hepatic function panel  . SARS-COV-2 IgG    Patient Instructions  Labs today Chest  xray today Call GI to schedule colonoscopy.  Good to see you today. Call us with questions. Return as needed or in 1 year for next physical.   Follow up plan: Return in about 1 year (around 08/12/2020) for annual exam, prior fasting for blood work.  Ria Bush, MD

## 2019-08-16 ENCOUNTER — Encounter: Payer: Self-pay | Admitting: Family Medicine

## 2019-08-16 DIAGNOSIS — R12 Heartburn: Secondary | ICD-10-CM

## 2019-08-16 DIAGNOSIS — H8192 Unspecified disorder of vestibular function, left ear: Secondary | ICD-10-CM

## 2019-08-16 DIAGNOSIS — E860 Dehydration: Secondary | ICD-10-CM | POA: Insufficient documentation

## 2019-08-16 DIAGNOSIS — R0602 Shortness of breath: Secondary | ICD-10-CM

## 2019-08-16 DIAGNOSIS — K449 Diaphragmatic hernia without obstruction or gangrene: Secondary | ICD-10-CM | POA: Insufficient documentation

## 2019-08-16 DIAGNOSIS — H832X2 Labyrinthine dysfunction, left ear: Secondary | ICD-10-CM

## 2019-08-16 DIAGNOSIS — R0609 Other forms of dyspnea: Secondary | ICD-10-CM | POA: Insufficient documentation

## 2019-08-16 DIAGNOSIS — K219 Gastro-esophageal reflux disease without esophagitis: Secondary | ICD-10-CM | POA: Insufficient documentation

## 2019-08-16 HISTORY — DX: Heartburn: R12

## 2019-08-16 HISTORY — DX: Unspecified disorder of vestibular function, left ear: H81.92

## 2019-08-16 HISTORY — DX: Labyrinthine dysfunction, left ear: H83.2X2

## 2019-08-16 HISTORY — DX: Dehydration: E86.0

## 2019-08-16 HISTORY — DX: Shortness of breath: R06.02

## 2019-08-16 NOTE — Assessment & Plan Note (Signed)
Carries this diagnosis - update LFTs

## 2019-08-16 NOTE — Assessment & Plan Note (Signed)
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

## 2019-08-16 NOTE — Assessment & Plan Note (Signed)
Recent ER evaluation with diagnosis of dehydration. labwork reassuring including cycled cardiac enzymes as was EKG. Reviewed limiting caffeine (ie energy drink) and increasing water intake to prevent future dehydration.

## 2019-08-16 NOTE — Assessment & Plan Note (Signed)
Ok to continue prilosec OTC with tums PRN.

## 2019-08-16 NOTE — Assessment & Plan Note (Addendum)
Update FLP as he's fasting today.  The 10-year ASCVD risk score Christopher Contreras Christopher Contreras., et al., 2013) is: 9.8%   Values used to calculate the score:     Age: 64 years     Sex: Male     Is Non-Hispanic African American: No     Diabetic: No     Tobacco smoker: No     Systolic Blood Pressure: A999333 mmHg     Is BP treated: No     HDL Cholesterol: 44.2 mg/dL     Total Cholesterol: 204 mg/dL

## 2019-08-16 NOTE — Assessment & Plan Note (Signed)
Residual dyspnea along with loss of taste and smell since acute illness 05/2019- tested negative for covid-19 at that time but persistent suspicion for this - will check IgG antibody titers to SARS-CoV2. Check CXR today as well. Good O2 sats and normal exam is reassuring.

## 2019-09-18 ENCOUNTER — Telehealth: Payer: Self-pay | Admitting: Family Medicine

## 2019-09-18 DIAGNOSIS — R0602 Shortness of breath: Secondary | ICD-10-CM

## 2019-09-18 DIAGNOSIS — E785 Hyperlipidemia, unspecified: Secondary | ICD-10-CM

## 2019-09-18 NOTE — Telephone Encounter (Signed)
Referral placed.

## 2019-09-18 NOTE — Telephone Encounter (Signed)
Patient called today requesting a referral to [facility] heart care He stated he was in about a month ago for a hospital follow up and things have not improved since hospital visit.  Patient would like to see heart care to get further evaluation.   Patient stated he has relatives that work at [facility] heart care and recommended on of these providers  Dr Sherren Mocha Dr Irish Lack  Dr Aundra Dubin

## 2019-10-07 ENCOUNTER — Ambulatory Visit (INDEPENDENT_AMBULATORY_CARE_PROVIDER_SITE_OTHER): Payer: BC Managed Care – PPO | Admitting: Dermatology

## 2019-10-07 ENCOUNTER — Other Ambulatory Visit: Payer: Self-pay

## 2019-10-07 ENCOUNTER — Encounter: Payer: Self-pay | Admitting: Dermatology

## 2019-10-07 DIAGNOSIS — L578 Other skin changes due to chronic exposure to nonionizing radiation: Secondary | ICD-10-CM | POA: Diagnosis not present

## 2019-10-07 DIAGNOSIS — D492 Neoplasm of unspecified behavior of bone, soft tissue, and skin: Secondary | ICD-10-CM

## 2019-10-07 DIAGNOSIS — D485 Neoplasm of uncertain behavior of skin: Secondary | ICD-10-CM

## 2019-10-07 DIAGNOSIS — C44212 Basal cell carcinoma of skin of right ear and external auricular canal: Secondary | ICD-10-CM | POA: Diagnosis not present

## 2019-10-07 DIAGNOSIS — C4491 Basal cell carcinoma of skin, unspecified: Secondary | ICD-10-CM

## 2019-10-07 HISTORY — DX: Basal cell carcinoma of skin, unspecified: C44.91

## 2019-10-07 NOTE — Progress Notes (Signed)
   Follow-Up Visit   Subjective  Christopher Contreras is a 65 y.o. male who presents for the following: check spot (R ear, never cleared from last LN2 ~1.64yrs ago, started draining last week, tender to touch).   The following portions of the chart were reviewed this encounter and updated as appropriate:  Tobacco  Allergies  Meds  Problems  Med Hx  Surg Hx  Fam Hx      Review of Systems:  No other skin or systemic complaints except as noted in HPI or Assessment and Plan.  Objective  Well appearing patient in no apparent distress; mood and affect are within normal limits.  A focused examination was performed including R ear. Relevant physical exam findings are noted in the Assessment and Plan.  Objective  R sup helix: Crusted plaque 2.0 x 1.0cm        Assessment & Plan  Neoplasm of skin R sup helix  Skin / nail biopsy Type of biopsy: tangential   Informed consent: discussed and consent obtained   Timeout: patient name, date of birth, surgical site, and procedure verified   Procedure prep:  Patient was prepped and draped in usual sterile fashion Prep type:  Isopropyl alcohol Anesthesia: the lesion was anesthetized in a standard fashion   Anesthetic:  1% lidocaine w/ epinephrine 1-100,000 buffered w/ 8.4% NaHCO3 Instrument used: flexible razor blade   Outcome: patient tolerated procedure well   Post-procedure details: sterile dressing applied and wound care instructions given   Dressing type: bandage and petrolatum    Specimen 1 - Surgical pathology Differential Diagnosis: D48.5 BCC vs SCC vs other Check Margins: No Crusted plaque 2.0 x 1.0cm   Actinic Damage - diffuse scaly erythematous macules with underlying dyspigmentation - Recommend daily broad spectrum sunscreen SPF 30+ to sun-exposed areas, reapply every 2 hours as needed.  - Call for new or changing lesions.   Return in about 4 months (around 02/07/2020) for TBSE hx BCC.   I, Othelia Pulling, RMA, am  acting as scribe for Sarina Ser, MD .  Documentation: I have reviewed the above documentation for accuracy and completeness, and I agree with the above.  Sarina Ser, MD

## 2019-10-07 NOTE — Patient Instructions (Signed)

## 2019-10-13 ENCOUNTER — Telehealth: Payer: Self-pay

## 2019-10-13 NOTE — Telephone Encounter (Signed)
Patient called the front office about his BX results from [place]. I spoke with him and he is going to look into what [facility] provider he would like to go to and call me back tomorrow.

## 2019-10-14 ENCOUNTER — Encounter: Payer: Self-pay | Admitting: Dermatology

## 2019-10-19 ENCOUNTER — Other Ambulatory Visit: Payer: Self-pay

## 2019-10-19 ENCOUNTER — Encounter: Payer: Self-pay | Admitting: Cardiovascular Disease

## 2019-10-19 ENCOUNTER — Ambulatory Visit (INDEPENDENT_AMBULATORY_CARE_PROVIDER_SITE_OTHER): Payer: BC Managed Care – PPO | Admitting: Cardiovascular Disease

## 2019-10-19 VITALS — BP 130/80 | HR 82 | Ht 68.0 in | Wt 205.1 lb

## 2019-10-19 DIAGNOSIS — R072 Precordial pain: Secondary | ICD-10-CM

## 2019-10-19 DIAGNOSIS — R079 Chest pain, unspecified: Secondary | ICD-10-CM | POA: Diagnosis not present

## 2019-10-19 LAB — BASIC METABOLIC PANEL
BUN/Creatinine Ratio: 10 (ref 10–24)
BUN: 12 mg/dL (ref 8–27)
CO2: 22 mmol/L (ref 20–29)
Calcium: 9.3 mg/dL (ref 8.6–10.2)
Chloride: 103 mmol/L (ref 96–106)
Creatinine, Ser: 1.2 mg/dL (ref 0.76–1.27)
GFR calc Af Amer: 75 mL/min/{1.73_m2} (ref >59)
GFR calc non Af Amer: 65 mL/min/{1.73_m2} (ref >59)
Glucose: 91 mg/dL (ref 65–99)
Potassium: 4.4 mmol/L (ref 3.5–5.2)
Sodium: 139 mmol/L (ref 134–144)

## 2019-10-19 MED ORDER — METOPROLOL TARTRATE 100 MG PO TABS: ORAL_TABLET | ORAL | 0 refills | Status: DC

## 2019-10-19 NOTE — Patient Instructions (Addendum)
Medication Instructions:  No changes today *If you need a refill on your cardiac medications before your next appointment, please call your pharmacy*   Lab Work: BMET today  If you have labs (blood work) drawn today and your tests are completely normal, you will receive your results only by: Marland Kitchen MyChart Message (if you have MyChart) OR . A paper copy in the mail If you have any lab test that is abnormal or we need to change your treatment, we will call you to review the results.   Testing/Procedures: Your physician has requested that you have an echocardiogram. Echocardiography is a painless test that uses sound waves to create images of your heart. It provides your doctor with information about the size and shape of your heart and how well your heart's chambers and valves are working. This procedure takes approximately one hour. There are no restrictions for this procedure.  Your physician has requested that you have cardiac CT. Cardiac computed tomography (CT) is a painless test that uses an x-ray machine to take clear, detailed pictures of your heart. For further information please visit [email or web address][email or web address]. Please follow instruction sheet as given.   Follow-Up: At [facility], you and your health needs are our priority.  As part of our continuing mission to provide you with exceptional heart care, we have created designated Provider Care Teams.  These Care Teams include your primary Cardiologist (physician) and Advanced Practice Providers (APPs -  Physician Assistants and Nurse Practitioners) who all work together to provide you with the care you need, when you need it.   Your next appointment:   4 week(s)  The format for your next appointment:   In Person  Provider:   You may see Lauree Chandler, MD or one of the following Advanced Practice Providers on your designated Care Team:    Melina Copa, PA-C  Ermalinda Barrios, PA-C    Other Instructions  Your cardiac CT will  be scheduled at one of the below locations:   [facility] [address] [place], [place] [address] [phone]  If scheduled at [facility], please arrive at the [facility] main entrance of [facility] 30 minutes prior to test start time. Proceed to the [facility] Radiology Department (first floor) to check-in and test prep.  Please follow these instructions carefully (unless otherwise directed):  Hold all erectile dysfunction medications at least 3 days (72 hrs) prior to test.  On the Night Before the Test: . Be sure to Drink plenty of water. . Do not consume any caffeinated/decaffeinated beverages or chocolate 12 hours prior to your test. Do not take any antihistamines 12 hours prior to your test.  On the Day of the Test: . Drink plenty of water. Do not drink any water within one hour of the test. . Do not eat any food 4 hours prior to the test. . You may take your regular medications prior to the test.  . Take metoprolol (Lopressor) two hours prior to test.       After the Test: . Drink plenty of water. . After receiving IV contrast, you may experience a mild flushed feeling. This is normal. . On occasion, you may experience a mild rash up to 24 hours after the test. This is not dangerous. If this occurs, you can take Benadryl 25 mg and increase your fluid intake. . If you experience trouble breathing, this can be serious. If it is severe call 911 IMMEDIATELY. If it is mild,  please call our office. . If you take any of these medications: Glipizide/Metformin, Avandament, Glucavance, please do not take 48 hours after completing test unless otherwise instructed.   Once we have confirmed authorization from your insurance company, we will call you to set up a date and time for your test.   For non-scheduling related questions, please contact the cardiac imaging nurse navigator should you have any questions/concerns: Marchia Bond, Cardiac Imaging  Nurse Navigator Burley Saver, Interim Cardiac Imaging Nurse [place] [facility] Direct Office Dial: [phone]   For scheduling needs, including cancellations and rescheduling, please call [phone].

## 2019-10-19 NOTE — Progress Notes (Signed)
Chief Complaint  Patient presents with  . New Patient (Initial Visit)    chest pain, elevated blood pressure   History of Present Illness: 64 yo male with history of anxiety, prostate cancer, Gilbert Syndrome and HLD here today as a new patient for the evaluation of elevated blood pressure. He had Covid 19 in January 2021. He went to the ED at [facility] mid March 2021 feeling poorly. BP was elevated. Troponin negative and other labs unrevealing. Chest x-ray normal. Mid April and again last week he had spikes in his BP up to 99991111 systolic. When his blood pressure goes up, he feels chest pain and nausea. He has no exertional chest pain. He is very active. No known cardiac disease.   Primary Care Physician: Ria Bush, MD   Past Medical History:  Diagnosis Date  . Abdominal discomfort, epigastric 07/20/2016  . Anxiety   . ANXIETY 12/27/2008   Qualifier: Diagnosis of  By: Council Mechanic MD, Hilaria Ota   . Basal cell carcinoma of face    R neck and back txted in past  . BPH (benign prostatic hyperplasia) 11/12/2016   S/p benign prostate biopsy by Dr Yves Dill (10/2016)  . Dehydration 08/16/2019  . Disorders of bilirubin excretion   . Diverticulosis of colon 11/02  . DIVERTICULOSIS, COLON 03/17/2008   Qualifier: Diagnosis of  By: Council Mechanic MD, Hilaria Ota   . Erectile dysfunction 02/13/2017  . Family history of adverse reaction to anesthesia    mother and father diffucult to arouse   . Fatigue 07/20/2016  . Gilbert syndrome 03/17/2008  . Heartburn 08/16/2019  . Hepatitis A was in 9th grade  . HLD (hyperlipidemia) 2/08  . Personal history of other infectious and parasitic disease    reports this as a hepatitis A infection in junior HS , denies any liver disorder   . Prostate cancer ([place])   . Pseudocholinesterase deficiency    patient reports this runs in his family ; but is unsure if he has the trait; see anethesia history for family history   . Shortness of breath 08/16/2019  . Vestibular disorder,  left    ear ; injury occurred in the [employer]   . 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 BASAL CELL CARCINOMA EXCISION  1/99   right face  . CATARACT EXTRACTION, BILATERAL  about 2-4 years ago   with bilateral lens placement  . COLONOSCOPY  03/29/01   Diverticulosis  . COLONOSCOPY  02/15/10   Divertics-Dr. Henrene Pastor  . LYMPHADENECTOMY Bilateral 09/15/2018   Procedure: LYMPHADENECTOMY, PELVIC;  Surgeon: Raynelle Bring, MD;  Location: [facility];  Service: Urology;  Laterality: Bilateral;  . ROBOT ASSISTED LAPAROSCOPIC RADICAL PROSTATECTOMY N/A 09/15/2018   Procedure: XI ROBOTIC ASSISTED LAPAROSCOPIC RADICAL PROSTATECTOMY LEVEL 2;  Raynelle Bring, MD)  . VASECTOMY  pre 1999   Dr. Rogers Blocker    Current Outpatient Medications  Medication Sig Dispense Refill  . metoprolol tartrate (LOPRESSOR) 100 MG tablet Take one tablet by mouth 2 hours prior to ct 1 tablet 0   No current facility-administered medications for this visit.    Allergies  Allergen Reactions  . Sulfonamide Derivatives     Unknown, childhood allergy    Social History   Socioeconomic History  . Marital status: Married    Spouse name: Not on file  . Number of children: 3  . Years of education: Not on file  .  Highest education level: Not on file  Occupational History  . Occupation: Works as an Glass blower/designer  Tobacco Use  . Smoking status: Never Smoker  . Smokeless tobacco: Never Used  Substance and Sexual Activity  . Alcohol use: Yes    Comment: Rare  . Drug use: No  . Sexual activity: Yes  Other Topics Concern  . Not on file  Social History Narrative   Married; lives with wife      3 sons      Retired International aid/development worker; [employer]         Social Determinants of Health   Financial Resource Strain:   . Difficulty of Paying Living Expenses:   Food Insecurity:   . Worried About Charity fundraiser in the Last Year:   .  Arboriculturist in the Last Year:   Transportation Needs:   . Film/video editor (Medical):   Marland Kitchen Lack of Transportation (Non-Medical):   Physical Activity:   . Days of Exercise per Week:   . Minutes of Exercise per Session:   Stress:   . Feeling of Stress :   Social Connections:   . Frequency of Communication with Friends and Family:   . Frequency of Social Gatherings with Friends and Family:   . Attends Religious Services:   . Active Member of Clubs or Organizations:   . Attends Archivist Meetings:   Marland Kitchen Marital Status:   Intimate Partner Violence:   . Fear of Current or Ex-Partner:   . Emotionally Abused:   Marland Kitchen Physically Abused:   . Sexually Abused:     Family History  Problem Relation Age of Onset  . Prostate cancer Father        s/p prostatectomy  . Other Father        Back pain with neuropathy  . Heart failure Father   . Colon cancer Mother 54  . Colon polyps Sister   . Colon polyps Sister   . Heart attack Other        Maternal side  . Hypertension Other        Both sides  . Diabetes Son   . Diabetes Paternal Grandfather   . Bone cancer Maternal Grandmother   . Heart failure Maternal Grandfather     Review of Systems:  As stated in the HPI and otherwise negative.   BP 130/80   Pulse 82   Ht 5\' 8"  (1.727 m)   Wt 205 lb 1.9 oz (93 kg)   SpO2 98%   BMI 31.19 kg/m   Physical Examination: General: Well developed, well nourished, NAD  HEENT: OP clear, mucus membranes moist  SKIN: warm, dry. No rashes. Neuro: No focal deficits  Musculoskeletal: Muscle strength 5/5 all ext  Psychiatric: Mood and affect normal  Neck: No JVD, no carotid bruits, no thyromegaly, no lymphadenopathy.  Lungs:Clear bilaterally, no wheezes, rhonci, crackles Cardiovascular: Regular rate and rhythm. No murmurs, gallops or rubs. Abdomen:Soft. Bowel sounds present. Non-tender.  Extremities: No lower extremity edema. Pulses are 2 + in the bilateral DP/PT.  EKG:  EKG is not  ordered today. The ekg ordered today demonstrates  EKG from march 2021 reviewed and shows sinus with poor R wave progression  Recent Labs: 08/10/2019: BUN 14; Creatinine, Ser 1.22; Hemoglobin 15.8; Platelets 194; Potassium 3.7; Sodium 139 08/13/2019: ALT 45   Lipid Panel    Component Value Date/Time   CHOL 204 (H) 08/13/2019 0942   TRIG 74.0 08/13/2019 0942  HDL 44.20 08/13/2019 0942   CHOLHDL 5 08/13/2019 0942   VLDL 14.8 08/13/2019 0942   LDLCALC 145 (H) 08/13/2019 0942   LDLDIRECT 145.9 03/27/2010 0818     Wt Readings from Last 3 Encounters:  10/19/19 205 lb 1.9 oz (93 kg)  08/13/19 207 lb 1 oz (93.9 kg)  08/10/19 200 lb (90.7 kg)      Assessment and Plan:   1. Chest pain: He has chest burning at times followed by nausea and then spikes in blood pressure. Will arrange an echo and gated cardiac CTA.   2. HTN: BP ok today. Will follow a log of BP at home twice daily. May need to start an antihypertensive medication if BP running high at home.   Current medicines are reviewed at length with the patient today.  The patient does not have concerns regarding medicines.  The following changes have been made:  no change  Labs/ tests ordered today include:   Orders Placed This Encounter  Procedures  . CT CORONARY MORPH W/CTA COR W/SCORE W/CA W/CM &/OR WO/CM  . CT CORONARY FRACTIONAL FLOW RESERVE DATA PREP  . CT CORONARY FRACTIONAL FLOW RESERVE FLUID ANALYSIS  . Basic metabolic panel  . ECHOCARDIOGRAM COMPLETE     Disposition:   FU with me in 3-4 weeks.    Signed, Lauree Chandler, MD 10/19/2019 9:45 AM    [facility] [place], [place], [place]  [address] Phone: [phone]; Fax: [phone]

## 2019-10-22 ENCOUNTER — Telehealth: Payer: Self-pay

## 2019-10-22 ENCOUNTER — Ambulatory Visit (HOSPITAL_COMMUNITY): Payer: BC Managed Care – PPO | Attending: Cardiology

## 2019-10-22 ENCOUNTER — Other Ambulatory Visit: Payer: Self-pay

## 2019-10-22 DIAGNOSIS — R072 Precordial pain: Secondary | ICD-10-CM

## 2019-10-22 DIAGNOSIS — R079 Chest pain, unspecified: Secondary | ICD-10-CM | POA: Diagnosis not present

## 2019-10-22 NOTE — Telephone Encounter (Signed)
Called pt LM on VM we have not heard back from him to let us know which Mohs doctor he would like to be referred to, please give [place] a call back with that information

## 2019-10-22 NOTE — Telephone Encounter (Signed)
error 

## 2019-10-22 NOTE — Telephone Encounter (Signed)
-----   Message from Ralene Bathe, MD sent at 10/13/2019 11:54 AM EDT ----- Skin , right sup helix SUPERFICIAL AND NODULAR BASAL CELL CARCINOMA, ULCERATED  Cancer - BCC Recommend MOHS Please schedule pt

## 2019-10-22 NOTE — Telephone Encounter (Signed)
Pt wanted to let Dr Danise Mina know he has has 3 more episodes of his BP spiking and feeling terrible. Worst was this past Tuesday 5-25. Been out of work since. Having ECHO today. Just wanted Dr to know. Call back if needed [phone].

## 2019-10-23 ENCOUNTER — Encounter: Payer: Self-pay | Admitting: Family Medicine

## 2019-10-23 ENCOUNTER — Ambulatory Visit (INDEPENDENT_AMBULATORY_CARE_PROVIDER_SITE_OTHER): Payer: BC Managed Care – PPO | Admitting: Family Medicine

## 2019-10-23 VITALS — BP 120/76 | HR 94 | Temp 97.6°F | Ht 68.0 in | Wt 200.1 lb

## 2019-10-23 DIAGNOSIS — F411 Generalized anxiety disorder: Secondary | ICD-10-CM | POA: Diagnosis not present

## 2019-10-23 DIAGNOSIS — R232 Flushing: Secondary | ICD-10-CM

## 2019-10-23 DIAGNOSIS — R0602 Shortness of breath: Secondary | ICD-10-CM

## 2019-10-23 DIAGNOSIS — R1013 Epigastric pain: Secondary | ICD-10-CM | POA: Diagnosis not present

## 2019-10-23 DIAGNOSIS — R12 Heartburn: Secondary | ICD-10-CM | POA: Diagnosis not present

## 2019-10-23 LAB — LIPASE: Lipase: 46 U/L (ref 11.0–59.0)

## 2019-10-23 LAB — TSH: TSH: 1.31 u[IU]/mL (ref 0.35–4.50)

## 2019-10-23 MED ORDER — LORAZEPAM 0.5 MG PO TABS
0.5000 mg | ORAL_TABLET | Freq: Two times a day (BID) | ORAL | 0 refills | Status: DC | PRN
Start: 2019-10-23 — End: 2019-11-23

## 2019-10-23 MED ORDER — OMEPRAZOLE 40 MG PO CPDR: 40.0000 mg | DELAYED_RELEASE_CAPSULE | Freq: Every day | ORAL | 3 refills | Status: DC

## 2019-10-23 NOTE — Assessment & Plan Note (Addendum)
Start PPI daily x 3 wks.  Refer to GI.  fmhx hiatal hernia.

## 2019-10-23 NOTE — Patient Instructions (Addendum)
Labs today Sent home with urine and stool tests.  Start omeprazole 40mg  daily for 3 weeks then as needed May use lorazepam as needed for sleep or anxiety  We will refer you to GI for further evaluation.

## 2019-10-23 NOTE — Telephone Encounter (Signed)
Will see today.  

## 2019-10-23 NOTE — Telephone Encounter (Signed)
Christopher Contreras - Client TELEPHONE ADVICE RECORD [facility] Patient Name: Christopher Contreras Gender: Male DOB: [birth year] Age: 64 Y 36 M 15 D Return Phone Number: [phone] (Primary) Address: City/State/Zip: [place] [address] Client [facility] Contreras - Client Client Site [place] Physician Ria Bush - MD Contact Type Call Who Is Calling Patient / Member / Family / Caregiver Call Type Triage / Clinical Relationship To Patient Self Return Phone Number [phone] (Primary) Chief Complaint Blood Pressure High Reason for Call Symptomatic / Request for [place] states he has been experiencing high blood pressure and yesterday he felt like he was going to pass out. Caller states he is also experiencing GI issues and weakness. Translation No Nurse Assessment Nurse: Thad Ranger, RN, Denise Date/Time (Eastern Time): 10/23/2019 8:41:06 AM Confirm and document reason for call. If symptomatic, describe symptoms. ---Caller states he has been experiencing high blood pressure and yesterday he felt like he was going to pass out. Caller states he is also experiencing GI issues and weakness. BP unknown at this time. States if he tries to eat/drink, it feels like it gets stuck in his esophagus. Seen by Cardio yesterday and states MD feels like this is a GI issue instead of cardiac issue when he has CP when eating. Has the patient had close contact with a person known or suspected to have the novel coronavirus illness OR traveled / lives in area with major community spread (including international travel) in the last 14 days from the onset of symptoms? * If Asymptomatic, screen for exposure and travel within the last 14 days. ---No Does the patient have any new or worsening symptoms? ---Yes Will a triage be completed? ---Yes Related visit to physician within the last 2 weeks?  ---Yes Does the PT have any chronic conditions? (i.e. diabetes, asthma, this includes High risk factors for pregnancy, etc.) ---Yes List chronic conditions. ---DENIES hx of HTN Is this a behavioral health or substance abuse call? ---No PLEASE NOTE: All timestamps contained within this report are represented as [place] Standard Time. CONFIDENTIALTY NOTICE: This fax transmission is intended only for the addressee. It contains information that is legally privileged, confidential or otherwise protected from use or disclosure. If you are not the intended recipient, you are strictly prohibited from reviewing, disclosing, copying using or disseminating any of this information or taking any action in reliance on or regarding this information. If you have received this fax in error, please notify us immediately by telephone so that we can arrange for its return to [place]. Phone: [phone], Toll-Free: [phone], Fax: [phone] Page: 2 of 2 Call Id: [id number] Guidelines Guideline Title Affirmed Question Affirmed Notes Nurse Date/Time Eilene Ghazi Time) Swallowing Difficulty [1] Swallowing difficulty AND [2] cause unknown (Exception: difficulty swallowing is a chronic symptom) Carmon, RN, Langley Gauss 10/23/2019 8:44:58 AM Disp. Time Eilene Ghazi Time) Disposition Final User 10/23/2019 8:52:28 AM See PCP within 24 Hours Yes Carmon, RN, Yevette Edwards Disagree/Comply Comply Caller Understands Yes PreDisposition Call Doctor Care Advice Given Per Guideline SEE PCP WITHIN 24 HOURS: CALL BACK IF: * You become worse. CARE ADVICE given per Swallowed Difficulty (Adult) guideline. Referrals REFERRED TO PCP OFFICE

## 2019-10-23 NOTE — Assessment & Plan Note (Addendum)
Ongoing, ?post-COVID related. recent normal CXR.

## 2019-10-23 NOTE — Progress Notes (Signed)
This visit was conducted in person.  BP 120/76 (BP Location: Left Arm, Patient Position: Sitting, Cuff Size: Normal)   Pulse 94   Temp 97.6 F (36.4 C) (Temporal)   Ht 5\' 8"  (1.727 m)   Wt 200 lb 2 oz (90.8 kg)   SpO2 97%   BMI 30.43 kg/m    CC: abd pain Subjective:    Patient ID: Christopher Contreras, male    DOB: 14-Jun-1955, 64 y.o.   MRN: [id number]  HPI: Christopher Contreras is a 64 y.o. male presenting on 10/23/2019 for Abdominal Pain (C/o abd discomfort in epigastric region, recent spikes in BP, fatigue and trouble getting full breath.  Had COVID-19 in 05/2019.   Seen previously for chest discomfort. )   See prior note for details.  Seen at ER 07/2019 with episode of nausea associated with hypertension and dizziness/lightheadedness thought possibly related to dehydration. Reassuring cardiac evaluation at ER. Seen in office for f/u with normal blood pressures at that time. Given ongoing symptoms, evaluated by cardiology Dr Angelena Form with normal echocardiogram yesterday, pending coronary CTA.   Increased heartburn had been managed with tums and PRN PPI OTC. Most recently took OTC nexium on Tuesday x1.   Ongoing trouble - has had 4 more episodes since 07/2019. Tend to happen after meals. Recurrent episode started Tuesday - out of work since then. Flushed feeling. Staying nauseated. Tachycardic with episodes, hypertensive up to 200/130s. Feels better when laying down. Exaggerated acid reflux response when episodes occur. Some early satiety with attacks but not otherwise. Epigastric discomfort described as dull ache, no radiation to back.   Notes increased stress, trouble sleeping.  Has lost 5 lbs this week.   No vomiting. No fever/chills, night sweats. No dysphagia. No early satiety. No headaches.  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   BP at home otherwise running well. Pulse normally 50-60s when resting.  Requests [facility] ([facility]) vs [facility]) if GI needed.       Relevant past medical, surgical, family and social history reviewed and updated as indicated. Interim medical history since our last visit reviewed. Allergies and medications reviewed and updated. Outpatient Medications Prior to Visit  Medication Sig Dispense Refill  . metoprolol tartrate (LOPRESSOR) 100 MG tablet Take one tablet by mouth 2 hours prior to ct 1 tablet 0   No facility-administered medications prior to visit.     Per HPI unless specifically indicated in ROS section below Review of Systems Objective:  BP 120/76 (BP Location: Left Arm, Patient Position: Sitting, Cuff Size: Normal)   Pulse 94   Temp 97.6 F (36.4 C) (Temporal)   Ht 5\' 8"  (1.727 m)   Wt 200 lb 2 oz (90.8 kg)   SpO2 97%   BMI 30.43 kg/m   Wt Readings from Last 3 Encounters:  10/23/19 200 lb 2 oz (90.8 kg)  10/19/19 205 lb 1.9 oz (93 kg)  08/13/19 207 lb 1 oz (93.9 kg)      Physical Exam Vitals and nursing note reviewed.  Constitutional:      Appearance: Normal appearance. He is not ill-appearing.  Eyes:     Extraocular Movements: Extraocular movements intact.     Conjunctiva/sclera: Conjunctivae normal.     Pupils: Pupils are equal, round, and reactive to light.  Neck:     Thyroid: No thyromegaly or thyroid tenderness.  Cardiovascular:     Rate and Rhythm: Normal rate and regular rhythm.     Pulses: Normal pulses.  Heart sounds: Normal heart sounds. No murmur.  Pulmonary:     Effort: Pulmonary effort is normal. No respiratory distress.     Breath sounds: Normal breath sounds. No wheezing, rhonchi or rales.  Abdominal:     General: Abdomen is flat. Bowel sounds are normal. There is no distension.     Palpations: Abdomen is soft. There is no mass.     Tenderness: There is no abdominal tenderness. There is no right CVA tenderness, left CVA tenderness, guarding or rebound.     Hernia: No hernia is present.  Musculoskeletal:     Cervical back: Normal range of motion and neck supple.      Right lower leg: No edema.     Left lower leg: No edema.  Skin:    General: Skin is warm and dry.     Findings: No rash.  Neurological:     Mental Status: He is alert.  Psychiatric:        Mood and Affect: Mood normal.        Behavior: Behavior normal.       Results for orders placed or performed in visit on 99991111  Basic metabolic panel  Result Value Ref Range   Glucose 91 65 - 99 mg/dL   BUN 12 8 - 27 mg/dL   Creatinine, Ser 1.20 0.76 - 1.27 mg/dL   GFR calc non Af Amer 65 >59 mL/min/1.73   GFR calc Af Amer 75 >59 mL/min/1.73   BUN/Creatinine Ratio 10 10 - 24   Sodium 139 134 - 144 mmol/L   Potassium 4.4 3.5 - 5.2 mmol/L   Chloride 103 96 - 106 mmol/L   CO2 22 20 - 29 mmol/L   Calcium 9.3 8.6 - 10.2 mg/dL   2d Echo WNL (09/2019) Coronary CTA pending Assessment & Plan:  This visit occurred during the SARS-CoV-2 public health emergency.  Safety protocols were in place, including screening questions prior to the visit, additional usage of staff PPE, and extensive cleaning of exam room while observing appropriate contact time as indicated for disinfecting solutions.   Problem List Items Addressed This Visit    Shortness of breath    Ongoing, ?post-COVID related. recent normal CXR.      Heartburn    Start PPI daily x 3 wks.  Refer to GI.  fmhx hiatal hernia.       Relevant Orders   Ambulatory referral to Gastroenterology   Anxiety state    Situational around these episodes of malaise, affecting sleep - will Rx lorazepam PRN - seems to have tolerated well in the past.       Relevant Medications   LORazepam (ATIVAN) 0.5 MG tablet   Abdominal discomfort, epigastric - Primary    Recent episodes of flushing, hypertension, associated with nausea and epigastric discomfort as well as significant heartburn, of unclear etiology. Reassuring cardiac eval so far, pending coronary CT. Ongoing for several months now, but infrequent flares. Has needed to stay out of work due to  severity of symptoms. Will screen for pheochromocytoma, H pylori, check lipase and TSH. ddx may include GIST. Will refer to GI for further evaluation and in interim start daily PPI x 3 wks. Pt agrees with plan. No significant red flags.       Relevant Orders   Lipase   Helicobacter pylori special antigen   Ambulatory referral to Gastroenterology    Other Visit Diagnoses    Flushing       Relevant Orders   TSH  Catecholamines, fractionated, urine, 24 hour   Metanephrines, urine, 24 hour       Meds ordered this encounter  Medications  . LORazepam (ATIVAN) 0.5 MG tablet    Sig: Take 1 tablet (0.5 mg total) by mouth 2 (two) times daily as needed for anxiety or sleep (sedation precautions).    Dispense:  30 tablet    Refill:  0  . omeprazole (PRILOSEC) 40 MG capsule    Sig: Take 1 capsule (40 mg total) by mouth daily. For 3 weeks then as needed    Dispense:  30 capsule    Refill:  3   Orders Placed This Encounter  Procedures  . Helicobacter pylori special antigen  . TSH  . Catecholamines, fractionated, urine, 24 hour    Standing Status:   Future    Standing Expiration Date:   10/22/2020  . Metanephrines, urine, 24 hour    Standing Status:   Future    Standing Expiration Date:   10/22/2020  . Lipase  . Ambulatory referral to Gastroenterology    Referral Priority:   Routine    Referral Type:   Consultation    Referral Reason:   Specialty Services Required    Number of Visits Requested:   1    Patient Instructions  Labs today Sent home with urine and stool tests.  Start omeprazole 40mg  daily for 3 weeks then as needed May use lorazepam as needed for sleep or anxiety  We will refer you to GI for further evaluation.   Follow up plan: Return if symptoms worsen or fail to improve.  Ria Bush, MD

## 2019-10-23 NOTE — Telephone Encounter (Signed)
Per appt notes pt has appt 10/23/19 at 12:15 with Dr Danise Mina.

## 2019-10-23 NOTE — Assessment & Plan Note (Signed)
Recent episodes of flushing, hypertension, associated with nausea and epigastric discomfort as well as significant heartburn, of unclear etiology. Reassuring cardiac eval so far, pending coronary CT. Ongoing for several months now, but infrequent flares. Has needed to stay out of work due to severity of symptoms. Will screen for pheochromocytoma, H pylori, check lipase and TSH. ddx may include GIST. Will refer to GI for further evaluation and in interim start daily PPI x 3 wks. Pt agrees with plan. No significant red flags.

## 2019-10-23 NOTE — Assessment & Plan Note (Signed)
Situational around these episodes of malaise, affecting sleep - will Rx lorazepam PRN - seems to have tolerated well in the past.

## 2019-10-27 ENCOUNTER — Other Ambulatory Visit: Payer: Self-pay

## 2019-10-27 ENCOUNTER — Other Ambulatory Visit: Payer: BC Managed Care – PPO

## 2019-10-27 DIAGNOSIS — R12 Heartburn: Secondary | ICD-10-CM

## 2019-10-27 DIAGNOSIS — R1013 Epigastric pain: Secondary | ICD-10-CM

## 2019-10-27 DIAGNOSIS — R232 Flushing: Secondary | ICD-10-CM

## 2019-10-27 DIAGNOSIS — C44212 Basal cell carcinoma of skin of right ear and external auricular canal: Secondary | ICD-10-CM

## 2019-10-27 NOTE — Telephone Encounter (Signed)
Patient has decided to schedule with either Dr. Link Snuffer or Dr. Danielle Dess at [facility]. Referral has been faxed.

## 2019-10-28 ENCOUNTER — Telehealth: Payer: Self-pay

## 2019-10-28 ENCOUNTER — Telehealth: Payer: Self-pay | Admitting: Family Medicine

## 2019-10-28 LAB — HELICOBACTER PYLORI  SPECIAL ANTIGEN
MICRO NUMBER:: 10538615
SPECIMEN QUALITY: ADEQUATE

## 2019-10-28 NOTE — Telephone Encounter (Signed)
-----   Message from Ralene Bathe, MD sent at 10/13/2019 11:54 AM EDT ----- Skin , right sup helix SUPERFICIAL AND NODULAR BASAL CELL CARCINOMA, ULCERATED  Cancer - BCC Recommend MOHS Please schedule pt

## 2019-10-28 NOTE — Telephone Encounter (Signed)
Glad to hear.  Thank you 

## 2019-10-28 NOTE — Telephone Encounter (Signed)
I spoke with the patient this morning and he states that he is scheduled with Dr. Danielle Dess at [facility] in [place] at 7:30am.

## 2019-10-28 NOTE — Telephone Encounter (Signed)
Patient called to let you know that he has an appointment on Monday June 7th with Dr Earlean Shawl. (GI)

## 2019-10-31 LAB — CATECHOLAMINES, FRACTIONATED, URINE, 24 HOUR
Calc Total (E+NE): 38 mcg/24 h (ref 26–121)
Creatinine, Urine mg/day-CATEUR: 1.79 g/(24.h) (ref 0.50–2.15)
Dopamine 24 Hr Urine: 270 mcg/24 h (ref 52–480)
Norepinephrine, 24H, Ur: 38 mcg/24 h (ref 15–100)
Total Volume: 2400 mL

## 2019-10-31 LAB — METANEPHRINES, URINE, 24 HOUR
Metaneph Total, Ur: 348 mcg/24 h (ref 224–832)
Metanephrines, Ur: 82 mcg/24 h — ABNORMAL LOW (ref 90–315)
Normetanephrine, 24H Ur: 266 mcg/24 h (ref 122–676)
Volume, Urine-VMAUR: 2400 mL

## 2019-11-02 DIAGNOSIS — Z800 Family history of malignant neoplasm of digestive organs: Secondary | ICD-10-CM | POA: Diagnosis not present

## 2019-11-02 DIAGNOSIS — R1013 Epigastric pain: Secondary | ICD-10-CM | POA: Diagnosis not present

## 2019-11-02 NOTE — Addendum Note (Signed)
Addended by: Mendel Ryder on: 11/02/2019 11:34 AM   Modules accepted: Orders

## 2019-11-05 DIAGNOSIS — K760 Fatty (change of) liver, not elsewhere classified: Secondary | ICD-10-CM | POA: Diagnosis not present

## 2019-11-10 ENCOUNTER — Telehealth (HOSPITAL_COMMUNITY): Payer: Self-pay | Admitting: *Deleted

## 2019-11-10 NOTE — Telephone Encounter (Signed)
Reaching out to patient to offer assistance regarding upcoming cardiac imaging study; pt verbalizes understanding of appt date/time, parking situation and where to check in, pre-test NPO status and medications ordered, and verified current allergies; name and call back number provided for further questions should they arise  [facility] [phone] office [phone] cell  Pt informed that he would need a driver if he were to take ativan for anxiety.  Pt verbalized understanding.

## 2019-11-11 ENCOUNTER — Other Ambulatory Visit: Payer: Self-pay

## 2019-11-11 ENCOUNTER — Encounter (HOSPITAL_COMMUNITY): Payer: Self-pay

## 2019-11-11 ENCOUNTER — Ambulatory Visit (HOSPITAL_COMMUNITY)
Admission: RE | Admit: 2019-11-11 | Discharge: 2019-11-11 | Disposition: A | Discharge status: Home / Self Care | Payer: BC Managed Care – PPO | Source: Ambulatory Visit | Attending: Cardiovascular Disease | Admitting: Cardiovascular Disease

## 2019-11-11 DIAGNOSIS — R072 Precordial pain: Secondary | ICD-10-CM | POA: Diagnosis not present

## 2019-11-11 MED ORDER — NITROGLYCERIN 0.4 MG SL SUBL: 0.8000 mg | SUBLINGUAL_TABLET | Freq: Once | SUBLINGUAL | Status: AC

## 2019-11-11 MED ORDER — NITROGLYCERIN 0.4 MG SL SUBL
SUBLINGUAL_TABLET | SUBLINGUAL | Status: AC
  Administered 2019-11-11: 0.8 mg via SUBLINGUAL
  Filled 2019-11-11: qty 2

## 2019-11-11 MED ORDER — IOHEXOL 350 MG/ML SOLN
80.0000 mL | Freq: Once | INTRAVENOUS | Status: AC | PRN
  Administered 2019-11-11: 80 mL via INTRAVENOUS

## 2019-11-11 NOTE — Discharge Instructions (Signed)
Cardiac CT Angiogram A cardiac CT angiogram is a procedure to look at the heart and the area around the heart. It may be done to help find the cause of chest pains or other symptoms of heart disease. During this procedure, a substance called contrast dye is injected into the blood vessels in the area to be checked. A large X-ray machine, called a CT scanner, then takes detailed pictures of the heart and the surrounding area. The procedure is also sometimes called a coronary CT angiogram, coronary artery scanning, or CTA. A cardiac CT angiogram allows the health care provider to see how well blood is flowing to and from the heart. The health care provider will be able to see if there are any problems, such as:  Blockage or narrowing of the coronary arteries in the heart.  Fluid around the heart.  Signs of weakness or disease in the muscles, valves, and tissues of the heart. Tell a health care provider about:  Any allergies you have. This is especially important if you have had a previous allergic reaction to contrast dye.  All medicines you are taking, including vitamins, herbs, eye drops, creams, and over-the-counter medicines.  Any blood disorders you have.  Any surgeries you have had.  Any medical conditions you have.  Whether you are pregnant or may be pregnant.  Any anxiety disorders, chronic pain, or other conditions you have that may increase your stress or prevent you from lying still. What are the risks? Generally, this is a safe procedure. However, problems may occur, including:  Bleeding.  Infection.  Allergic reactions to medicines or dyes.  Damage to other structures or organs.  Kidney damage from the contrast dye that is used.  Increased risk of cancer from radiation exposure. This risk is low. Talk with your health care provider about: ? The risks and benefits of testing. ? How you can receive the lowest dose of radiation. What happens before the  procedure?  Wear comfortable clothing and remove any jewelry, glasses, dentures, and hearing aids.  Follow instructions from your health care provider about eating and drinking. This may include: ? For 12 hours before the procedure -- avoid caffeine. This includes tea, coffee, soda, energy drinks, and diet pills. Drink plenty of water or other fluids that do not have caffeine in them. Being well hydrated can prevent complications. ? For 4-6 hours before the procedure -- stop eating and drinking. The contrast dye can cause nausea, but this is less likely if your stomach is empty.  Ask your health care provider about changing or stopping your regular medicines. This is especially important if you are taking diabetes medicines, blood thinners, or medicines to treat problems with erections (erectile dysfunction). What happens during the procedure?   Hair on your chest may need to be removed so that small sticky patches called electrodes can be placed on your chest. These will transmit information that helps to monitor your heart during the procedure.  An IV will be inserted into one of your veins.  You might be given a medicine to control your heart rate during the procedure. This will help to ensure that good images are obtained.  You will be asked to lie on an exam table. This table will slide in and out of the CT machine during the procedure.  Contrast dye will be injected into the IV. You might feel warm, or you may get a metallic taste in your mouth.  You will be given a medicine called   nitroglycerin. This will relax or dilate the arteries in your heart.  The table that you are lying on will move into the CT machine tunnel for the scan.  The person running the machine will give you instructions while the scans are being done. You may be asked to: ? Keep your arms above your head. ? Hold your breath. ? Stay very still, even if the table is moving.  When the scanning is complete, you  will be moved out of the machine.  The IV will be removed. The procedure may vary among health care providers and hospitals. What can I expect after the procedure? After your procedure, it is common to have:  A metallic taste in your mouth from the contrast dye.  A feeling of warmth.  A headache from the nitroglycerin. Follow these instructions at home:  Take over-the-counter and prescription medicines only as told by your health care provider.  If you are told, drink enough fluid to keep your urine pale yellow. This will help to flush the contrast dye out of your body.  Most people can return to their normal activities right after the procedure. Ask your health care provider what activities are safe for you.  It is up to you to get the results of your procedure. Ask your health care provider, or the department that is doing the procedure, when your results will be ready.  Keep all follow-up visits as told by your health care provider. This is important. Contact a health care provider if:  You have any symptoms of allergy to the contrast dye. These include: ? Shortness of breath. ? Rash or hives. ? A racing heartbeat. Summary  A cardiac CT angiogram is a procedure to look at the heart and the area around the heart. It may be done to help find the cause of chest pains or other symptoms of heart disease.  During this procedure, a large X-ray machine, called a CT scanner, takes detailed pictures of the heart and the surrounding area after a contrast dye has been injected into blood vessels in the area.  Ask your health care provider about changing or stopping your regular medicines before the procedure. This is especially important if you are taking diabetes medicines, blood thinners, or medicines to treat erectile dysfunction.  If you are told, drink enough fluid to keep your urine pale yellow. This will help to flush the contrast dye out of your body. This information is not  intended to replace advice given to you by your health care provider. Make sure you discuss any questions you have with your health care provider. Document Revised: 01/07/2019 Document Reviewed: 01/07/2019 Elsevier Patient Education  2020 Else[employer] Inc. Testing With IV Contrast Material IV contrast material is a fluid that is used with some imaging tests. It is injected into your body through a vein. Contrast material is used when your health care providers need a detailed look at organs, tissues, or blood vessels that may not show up with the standard test. The material may be used when an X-ray, an MRI, a CT scan, or an ultrasound is done. IV contrast material may be used for imaging tests that check:  Muscles, skin, and fat.  Breasts.  Brain.  Digestive tract.  Heart.  Organs such as the liver, kidneys, lungs, bladder, and many others.  Arteries and veins. Tell a health care provider about:  Any allergies you have, especially an allergy to contrast material.  All medicines you are taking, including   metformin, beta blockers, NSAIDs (such as ibuprofen), interleukin-2, vitamins, herbs, eye drops, creams, and over-the-counter medicines.  Any problems you or family members have had with the use of contrast material.  Any blood disorders you have, such as sickle cell anemia.  Any surgeries you have had.  Any medical conditions you have or have had, especially alcohol abuse, dehydration, asthma, or kidney, liver, or heart problems.  Whether you are pregnant or may be pregnant.  Whether you are breastfeeding. Most contrast materials are safe for use in breastfeeding women. What are the risks? Generally, this is a safe procedure. However, problems may occur, including:  Headache.  Itching, skin rash, and hives.  Nausea and vomiting.  Allergic reactions.  Wheezing or difficulty breathing.  Abnormal heart rate.  Changes in blood pressure.  Throat swelling.  Kidney  damage. What happens before the procedure? Medicines Ask your health care provider about:  Changing or stopping your regular medicines. This is especially important if you are taking diabetes medicines or blood thinners.  Taking medicines such as aspirin and ibuprofen. These medicines can thin your blood. Do not take these medicines unless your health care provider tells you to take them.  Taking over-the-counter medicines, vitamins, herbs, and supplements. If you are at risk of having a reaction to the IV contrast material, you may be asked to take medicine before the procedure to prevent a reaction. General instructions  Follow instructions from your health care provider about eating or drinking restrictions.  You may have an exam or lab tests to make sure that you can safely get IV contrast material.  Ask if you will be given a medicine to help you relax (sedative) during the procedure. If so, plan to have someone take you home from the hospital or clinic. What happens during the procedure?  You may be given a sedative to help you relax.  An IV will be inserted into one of your veins.  Contrast material will be injected into your IV.  You may feel warmth or flushing as the contrast material enters your bloodstream.  You may have a metallic taste in your mouth for a few minutes.  The needle may cause some discomfort and bruising.  After the contrast material is in your body, the imaging test will be done. The procedure may vary among health care providers and hospitals. What can I expect after the procedure?  The IV will be removed.  You may be taken to a recovery area if sedation medicines were used. Your blood pressure, heart rate, breathing rate, and blood oxygen level will be monitored until you leave the hospital or clinic. Follow these instructions at home:   Take over-the-counter and prescription medicines only as told by your health care provider. ? Your health  care provider may tell you to not take certain medicines for a couple of days after the procedure. This is especially important if you are taking diabetes medicines.  If you are told, drink enough fluid to keep your urine pale yellow. This will help to remove the contrast material out of your body.  Do not drive for 24 hours if you were given a sedative during your procedure.  It is up to you to get the results of your procedure. Ask your health care provider, or the department that is doing the procedure, when your results will be ready.  Keep all follow-up visits as told by your health care provider. This is important. Contact a health care provider if:    You have redness, swelling, or pain near your IV site. Get help right away if:  You have an abnormal heart rhythm.  You have trouble breathing.  You have: ? Chest pain. ? Pain in your back, neck, arm, jaw, or stomach. ? Nausea or sweating. ? Hives or a rash.  You start shaking and cannot stop. These symptoms may represent a serious problem that is an emergency. Do not wait to see if the symptoms will go away. Get medical help right away. Call your local emergency services (911 in the [place] Do not drive yourself to the hospital. Summary  IV contrast material may be used for imaging tests to help your health care providers see your organs and tissues more clearly.  Tell your health care provider if you are pregnant or may be pregnant.  During the procedure, you may feel warmth or flushing as the contrast material enters your bloodstream.  After the procedure, drink enough fluid to keep your urine pale yellow. This information is not intended to replace advice given to you by your health care provider. Make sure you discuss any questions you have with your health care provider. Document Revised: 07/31/2018 Document Reviewed: 07/31/2018 Elsevier Patient Education  2020 Elsevier Inc.  

## 2019-11-18 ENCOUNTER — Ambulatory Visit (INDEPENDENT_AMBULATORY_CARE_PROVIDER_SITE_OTHER): Payer: BC Managed Care – PPO | Admitting: Cardiovascular Disease

## 2019-11-18 ENCOUNTER — Encounter: Payer: Self-pay | Admitting: Cardiovascular Disease

## 2019-11-18 ENCOUNTER — Other Ambulatory Visit: Payer: Self-pay

## 2019-11-18 VITALS — BP 126/70 | HR 73 | Ht 68.0 in | Wt 201.8 lb

## 2019-11-18 DIAGNOSIS — R079 Chest pain, unspecified: Secondary | ICD-10-CM

## 2019-11-18 DIAGNOSIS — I10 Essential (primary) hypertension: Secondary | ICD-10-CM

## 2019-11-18 NOTE — Progress Notes (Signed)
Chief Complaint  Patient presents with  . Follow-up    HTN   History of Present Illness: 64 yo male with history of anxiety, prostate cancer, Gilbert Syndrome and HLD here today for follow up. I saw him as a new patient 10/19/19 for the evaluation of elevated blood pressure and chest pain. He had Covid 19 in January 2021. He went to the ED at [facility] mid March 2021 feeling poorly. BP was elevated. Troponin negative and other labs unrevealing. Chest x-ray normal. He had several more spikes in his blood pressure. When his blood pressure goes up, he feels chest pain and nausea. He has no exertional chest pain. He is very active. No known cardiac disease. Echo 10/22/19 with LVEF=60-65%, no valve disease. Coronary CTA June 2021 with no evidence of CAD, calcium score zero.   He is here today for follow up. The patient denies any chest pain, dyspnea, palpitations, lower extremity edema, orthopnea, PND, dizziness, near syncope or syncope.    Primary Care Physician: Ria Bush, MD   Past Medical History:  Diagnosis Date  . Abdominal discomfort, epigastric 07/20/2016  . Anxiety   . ANXIETY 12/27/2008   Qualifier: Diagnosis of  By: Council Mechanic MD, Hilaria Ota   . Basal cell carcinoma of face    R neck and back txted in past  . BPH (benign prostatic hyperplasia) 11/12/2016   S/p benign prostate biopsy by Dr Yves Dill (10/2016)  . Dehydration 08/16/2019  . Disorders of bilirubin excretion   . Diverticulosis of colon 11/02  . DIVERTICULOSIS, COLON 03/17/2008   Qualifier: Diagnosis of  By: Council Mechanic MD, Hilaria Ota   . Erectile dysfunction 02/13/2017  . Family history of adverse reaction to anesthesia    mother and father diffucult to arouse   . Fatigue 07/20/2016  . Gilbert syndrome 03/17/2008  . Heartburn 08/16/2019  . Hepatitis A was in 9th grade  . HLD (hyperlipidemia) 2/08  . Personal history of other infectious and parasitic disease    reports this as a hepatitis A infection in junior HS , denies  any liver disorder   . Prostate cancer ([place])   . Pseudocholinesterase deficiency    patient reports this runs in his family ; but is unsure if he has the trait; see anethesia history for family history   . Shortness of breath 08/16/2019  . Vestibular disorder, left    ear ; injury occurred in the [employer]   . 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 BASAL CELL CARCINOMA EXCISION  1/99   right face  . CATARACT EXTRACTION, BILATERAL  about 2-4 years ago   with bilateral lens placement  . COLONOSCOPY  03/29/01   Diverticulosis  . COLONOSCOPY  02/15/10   Divertics-Dr. Henrene Pastor  . LYMPHADENECTOMY Bilateral 09/15/2018   Procedure: LYMPHADENECTOMY, PELVIC;  Surgeon: Raynelle Bring, MD;  Location: [facility];  Service: Urology;  Laterality: Bilateral;  . ROBOT ASSISTED LAPAROSCOPIC RADICAL PROSTATECTOMY N/A 09/15/2018   Procedure: XI ROBOTIC ASSISTED LAPAROSCOPIC RADICAL PROSTATECTOMY LEVEL 2;  Raynelle Bring, MD)  . VASECTOMY  pre 1999   Dr. Rogers Blocker    Current Outpatient Medications  Medication Sig Dispense Refill  . LORazepam (ATIVAN) 0.5 MG tablet Take 1 tablet (0.5 mg total) by mouth 2 (two) times daily as needed for anxiety or sleep (sedation precautions). 30 tablet 0  . omeprazole (PRILOSEC) 40 MG capsule Take 1 capsule (40 mg total)  by mouth daily. For 3 weeks then as needed 30 capsule 3   No current facility-administered medications for this visit.    Allergies  Allergen Reactions  . Sulfonamide Derivatives     Unknown, childhood allergy    Social History   Socioeconomic History  . Marital status: Married    Spouse name: Not on file  . Number of children: 3  . Years of education: Not on file  . Highest education level: Not on file  Occupational History  . Occupation: Works as an Glass blower/designer  Tobacco Use  . Smoking status: Never Smoker  . Smokeless tobacco: Never Used    Vaping Use  . Vaping Use: Never used  Substance and Sexual Activity  . Alcohol use: Yes    Comment: Rare  . Drug use: No  . Sexual activity: Yes  Other Topics Concern  . Not on file  Social History Narrative   Married; lives with wife      3 sons      Retired International aid/development worker; [employer]         Social Determinants of Health   Financial Resource Strain:   . Difficulty of Paying Living Expenses:   Food Insecurity:   . Worried About Charity fundraiser in the Last Year:   . Arboriculturist in the Last Year:   Transportation Needs:   . Film/video editor (Medical):   Marland Kitchen Lack of Transportation (Non-Medical):   Physical Activity:   . Days of Exercise per Week:   . Minutes of Exercise per Session:   Stress:   . Feeling of Stress :   Social Connections:   . Frequency of Communication with Friends and Family:   . Frequency of Social Gatherings with Friends and Family:   . Attends Religious Services:   . Active Member of Clubs or Organizations:   . Attends Archivist Meetings:   Marland Kitchen Marital Status:   Intimate Partner Violence:   . Fear of Current or Ex-Partner:   . Emotionally Abused:   Marland Kitchen Physically Abused:   . Sexually Abused:     Family History  Problem Relation Age of Onset  . Prostate cancer Father        s/p prostatectomy  . Other Father        Back pain with neuropathy  . Heart failure Father   . Colon cancer Mother 57  . Colon polyps Sister   . Colon polyps Sister   . Heart attack Other        Maternal side  . Hypertension Other        Both sides  . Diabetes Son   . Diabetes Paternal Grandfather   . Bone cancer Maternal Grandmother   . Heart failure Maternal Grandfather     Review of Systems:  As stated in the HPI and otherwise negative.   BP 126/70   Pulse 73   Ht 5\' 8"  (1.727 m)   Wt 201 lb 12.8 oz (91.5 kg)   SpO2 98%   BMI 30.68 kg/m   Physical Examination: General: Well developed, well nourished, NAD  HEENT: OP clear, mucus  membranes moist  SKIN: warm, dry. No rashes. Neuro: No focal deficits  Musculoskeletal: Muscle strength 5/5 all ext  Psychiatric: Mood and affect normal  Neck: No JVD, no carotid bruits, no thyromegaly, no lymphadenopathy.  Lungs:Clear bilaterally, no wheezes, rhonci, crackles Cardiovascular: Regular rate and rhythm. No murmurs, gallops or rubs. Abdomen:Soft. Bowel sounds present.  Non-tender.  Extremities: No lower extremity edema. Pulses are 2 + in the bilateral DP/PT.  EKG:  EKG is not ordered today. The ekg ordered today demonstrates   Echo 10/22/19: 1. Left ventricular ejection fraction, by estimation, is 60 to 65%. The  left ventricle has normal function. The left ventricle has no regional  wall motion abnormalities. There is mild left ventricular hypertrophy of  the basal-septal segment. Left  ventricular diastolic parameters are consistent with Grade I diastolic  dysfunction (impaired relaxation).  2. Right ventricular systolic function is normal. The right ventricular  size is normal. Tricuspid regurgitation signal is inadequate for assessing  PA pressure.  3. The mitral valve is normal in structure. No evidence of mitral valve  regurgitation. No evidence of mitral stenosis.  4. The aortic valve is normal in structure. Aortic valve regurgitation is  not visualized. No aortic stenosis is present.  5. The inferior vena cava is normal in size with greater than 50%  respiratory variability, suggesting right atrial pressure of 3 mmHg.   Recent Labs: 08/10/2019: Hemoglobin 15.8; Platelets 194 08/13/2019: ALT 45 10/19/2019: BUN 12; Creatinine, Ser 1.20; Potassium 4.4; Sodium 139 10/23/2019: TSH 1.31    Wt Readings from Last 3 Encounters:  11/18/19 201 lb 12.8 oz (91.5 kg)  10/23/19 200 lb 2 oz (90.8 kg)  10/19/19 205 lb 1.9 oz (93 kg)      Assessment and Plan:   1. Chest pain: Coronary CTA with calcium score zero and no evidence of CAD. Echo with normal LV systolic  function. His chest pain is not felt to be cardiac.    2. HTN: BP is normal on no therapy.   Current medicines are reviewed at length with the patient today.  The patient does not have concerns regarding medicines.  The following changes have been made:  no change  Labs/ tests ordered today include:   No orders of the defined types were placed in this encounter.    Disposition:   FU with me as needed.     Signed, Lauree Chandler, MD 11/18/2019 11:08 AM    [facility] [address], [place], [place]  [address] Phone: [phone]; Fax: [phone]

## 2019-11-18 NOTE — Patient Instructions (Signed)
Medication Instructions:  No changes *If you need a refill on your cardiac medications before your next appointment, please call your pharmacy*   Lab Work: none If you have labs (blood work) drawn today and your tests are completely normal, you will receive your results only by: Marland Kitchen MyChart Message (if you have MyChart) OR . A paper copy in the mail If you have any lab test that is abnormal or we need to change your treatment, we will call you to review the results.   Testing/Procedures: none   Follow-Up: as needed  Other Instructions

## 2019-11-23 ENCOUNTER — Encounter: Payer: Self-pay | Admitting: Family Medicine

## 2019-11-23 ENCOUNTER — Other Ambulatory Visit: Payer: Self-pay

## 2019-11-23 ENCOUNTER — Ambulatory Visit (INDEPENDENT_AMBULATORY_CARE_PROVIDER_SITE_OTHER): Payer: BC Managed Care – PPO | Admitting: Family Medicine

## 2019-11-23 VITALS — BP 118/72 | HR 88 | Temp 97.8°F | Ht 68.0 in | Wt 205.3 lb

## 2019-11-23 DIAGNOSIS — R0602 Shortness of breath: Secondary | ICD-10-CM

## 2019-11-23 DIAGNOSIS — F411 Generalized anxiety disorder: Secondary | ICD-10-CM | POA: Diagnosis not present

## 2019-11-23 DIAGNOSIS — R1013 Epigastric pain: Secondary | ICD-10-CM

## 2019-11-23 DIAGNOSIS — R12 Heartburn: Secondary | ICD-10-CM

## 2019-11-23 DIAGNOSIS — M79605 Pain in left leg: Secondary | ICD-10-CM | POA: Insufficient documentation

## 2019-11-23 MED ORDER — LORAZEPAM 0.5 MG PO TABS: 0.5000 mg | ORAL_TABLET | Freq: Two times a day (BID) | ORAL | 0 refills | Status: DC | PRN

## 2019-11-23 NOTE — Assessment & Plan Note (Addendum)
Reviewed recent stressors and possible relation to episodes of malaise, palpitations and GI distress. PHQ9 and GAD7 low risk as was ESS. Discussed healthy stress relieving strategies as well as trial of decreased responsibilities "taking things off his plate". If ongoing, discussed could consider trial of daily anxiety medication such as celexa, setraline. Pt will update me with effect of above.  Will refill lorazepam to use PRN.

## 2019-11-23 NOTE — Assessment & Plan Note (Addendum)
So far reassuring GI eval including abd [place] (I don't have copy of results). Pending EGD/colonoscopy.  Anticipate component of GERD as symptoms did improve on daily omeprazole for the past month. Discussed changing to QOD dosing in effort to slowly titrate med.  Appreciate GI care.

## 2019-11-23 NOTE — Assessment & Plan Note (Signed)
Improved with omeprazole. Now will change to QOD dosing.

## 2019-11-23 NOTE — Patient Instructions (Addendum)
Ok to get COVID vaccine.  Lorazepam refilled to use as needed.   Continue omeprazole every other day for now - try to taper off.  Try voltaren gel to left lateral lower leg - unsure what is causing discomfort.  I agree with 1 month trial of decreased responsibilities to only necessity, see how you do this month. We could consider trial of daily anxiety medicine like sertraline or celexa.

## 2019-11-23 NOTE — Assessment & Plan Note (Addendum)
Unclear etiology. Benign exam. Suggested OTC voltaren gel PRN.

## 2019-11-23 NOTE — Progress Notes (Signed)
This visit was conducted in person.  BP 118/72 (BP Location: Left Arm, Patient Position: Sitting, Cuff Size: Large)   Pulse 88   Temp 97.8 F (36.6 C) (Temporal)   Ht 5\' 8"  (1.727 m)   Wt 205 lb 5 oz (93.1 kg)   SpO2 97%   BMI 31.22 kg/m    CC: review workup to date Subjective:    Patient ID: Christopher Contreras, male    DOB: 05-01-1956, 64 y.o.   MRN: [id number]  HPI: Christopher Contreras is a 64 y.o. male presenting on 11/23/2019 for Results (Wants to discuss recent labs, CT and ECHO results. ) and Immunizations (Wants to discuss COVID vaccine. )   See prior notes for details.   Ongoing episodes, latest was 2 weekends ago associated with spikes in pulse. Had to miss work 2 days in a row that week due to feeling ill. GI symptoms are largely improved on omeprazole. Had episode of racing heart to 130 when playing with grandson outdoors throwing nuts. However recently weed-whacked 2 acre property without elevation in pulse.   Stress levels overall ok - he took lorazepam initially but no longer. Didn't take any last week, slept well. No depressed mood. Wonders if underlying stressors (deaths in family including father, work stress, 3 children getting married, 2 grandchildren, lost dog, church responsibilities, recent prostate cancer) have just been accumulating.   For ongoing epigastric discomfort associated with nausea, HTN, dizziness referred to cardiology with reassuring evaluation to date (echo with EF 60-65% and no valvular disease, CT cardiac calcium score with no evidence of CAD). Also referred to GI - ?biliary etiology - abd [place] was normal. Planned EGD/colonoscopy. Rec continue omeprazole 40mg  daily in interim.   Occasional snoring. No witnessed apneic episodes. Can wake up taking a deep breath. Non -restorative sleep. No daytime somnolence.   Notes aching pain to left lateral lower leg present for months. Denies inciting trauma/injury or falls. No leg swelling. No skin changes. Worse  with prolonged sitting in recliner with legs elevated.   H/o COVID-19 05/2019.  Planning trip in September [place]. Asks about COVID vaccine.      Relevant past medical, surgical, family and social history reviewed and updated as indicated. Interim medical history since our last visit reviewed. Allergies and medications reviewed and updated. Outpatient Medications Prior to Visit  Medication Sig Dispense Refill  . omeprazole (PRILOSEC) 40 MG capsule Take 1 capsule (40 mg total) by mouth daily. For 3 weeks then as needed 30 capsule 3  . LORazepam (ATIVAN) 0.5 MG tablet Take 1 tablet (0.5 mg total) by mouth 2 (two) times daily as needed for anxiety or sleep (sedation precautions). 30 tablet 0   No facility-administered medications prior to visit.     Per HPI unless specifically indicated in ROS section below Review of Systems Objective:  BP 118/72 (BP Location: Left Arm, Patient Position: Sitting, Cuff Size: Large)   Pulse 88   Temp 97.8 F (36.6 C) (Temporal)   Ht 5\' 8"  (1.727 m)   Wt 205 lb 5 oz (93.1 kg)   SpO2 97%   BMI 31.22 kg/m   Wt Readings from Last 3 Encounters:  11/23/19 205 lb 5 oz (93.1 kg)  11/18/19 201 lb 12.8 oz (91.5 kg)  10/23/19 200 lb 2 oz (90.8 kg)      Physical Exam Vitals and nursing note reviewed.  Constitutional:      Appearance: Normal appearance. He is not ill-appearing.  HENT:  Head: Normocephalic and atraumatic.  Eyes:     Extraocular Movements: Extraocular movements intact.     Pupils: Pupils are equal, round, and reactive to light.  Cardiovascular:     Rate and Rhythm: Normal rate and regular rhythm.     Pulses: Normal pulses.     Heart sounds: Normal heart sounds. No murmur heard.   Pulmonary:     Effort: Pulmonary effort is normal. No respiratory distress.     Breath sounds: Normal breath sounds. No wheezing, rhonchi or rales.  Musculoskeletal:     Right lower leg: No edema.     Left lower leg: No edema.     Comments:  No ankle  ligament laxity.  No palpable cords or asymmetric swelling.  No pain at fibular head/neck.  FROM at knees and ankles. No pain with strength testing of knee flexors/extenders or ankle dorsiflexion/plantar flexion, or foot inversion/eversion against resistance   Skin:    Findings: No rash.  Neurological:     General: No focal deficit present.     Mental Status: He is alert.  Psychiatric:        Mood and Affect: Mood normal.        Behavior: Behavior normal.       Results for orders placed or performed in visit on 00/93/81  Helicobacter pylori special antigen   Specimen: Stool  Result Value Ref Range   MICRO NUMBER: [id number]    SPECIMEN QUALITY Adequate    SOURCE: STOOL    STATUS: FINAL    RESULT:      Not Detected  Antimicrobials, proton pump inhibitors, and bismuth preparations inhibit H. pylori and ingestion up to two weeks prior to testing may cause false negative results. If clinically indicated the test should be repeated on a new specimen  obtained two weeks after discontinuing treatment.   Metanephrines, urine, 24 hour  Result Value Ref Range   Volume, Urine-VMAUR 2,400 mL   Metanephrines, Ur 82 (L) 90 - 315 mcg/24 h   Normetanephrine, 24H Ur 266 122 - 676 mcg/24 h   Metaneph Total, Ur 348 224 - 832 mcg/24 h  Catecholamines, fractionated, urine, 24 hour  Result Value Ref Range   Total Volume 2,400 mL   Epinephrine, 24H, Ur see note mcg/24 h   Norepinephrine, 24H, Ur 38 15 - 100 mcg/24 h   Calc Total (E+NE) 38 26 - 121 mcg/24 h   Dopamine 24 Hr Urine 270 52 - 480 mcg/24 h   Creatinine, Urine mg/day-CATEUR 1.79 0.50 - 2.15 g/24 h   Depression screen [facility] 2/9 11/23/2019  Decreased Interest 1  Down, Depressed, Hopeless 0  PHQ - 2 Score 1  Altered sleeping 0  Tired, decreased energy 2  Change in appetite 1  Feeling bad or failure about yourself  0  Trouble concentrating 1  Moving slowly or fidgety/restless 0  Suicidal thoughts 0  PHQ-9 Score 5   GAD 7 :  Generalized Anxiety Score 11/23/2019  Nervous, Anxious, on Edge 2  Control/stop worrying 1  Worry too much - different things 1  Trouble relaxing 2  Restless 0  Easily annoyed or irritable 0  Afraid - awful might happen 0  Total GAD 7 Score 6   ESS = 2 Assessment & Plan:  This visit occurred during the SARS-CoV-2 public health emergency.  Safety protocols were in place, including screening questions prior to the visit, additional usage of staff PPE, and extensive cleaning of exam room while observing appropriate contact  time as indicated for disinfecting solutions.   Problem List Items Addressed This Visit    Shortness of breath    So far reassuring cardiac workup including echo, cardiac calcium score of zero. If ongoing racing heart episodes could consider return to cardio to discuss possible event/holter monitor.       Left leg pain    Unclear etiology. Benign exam. Suggested OTC voltaren gel PRN.       Heartburn    Improved with omeprazole. Now will change to QOD dosing.       Anxiety state    Reviewed recent stressors and possible relation to episodes of malaise, palpitations and GI distress. PHQ9 and GAD7 low risk as was ESS. Discussed healthy stress relieving strategies as well as trial of decreased responsibilities "taking things off his plate". If ongoing, discussed could consider trial of daily anxiety medication such as celexa, setraline. Pt will update me with effect of above.  Will refill lorazepam to use PRN.       Relevant Medications   LORazepam (ATIVAN) 0.5 MG tablet   Abdominal discomfort, epigastric    So far reassuring GI eval including abd [place] (I don't have copy of results). Pending EGD/colonoscopy.  Anticipate component of GERD as symptoms did improve on daily omeprazole for the past month. Discussed changing to QOD dosing in effort to slowly titrate med.  Appreciate GI care.           Meds ordered this encounter  Medications  . LORazepam (ATIVAN) 0.5  MG tablet    Sig: Take 1 tablet (0.5 mg total) by mouth 2 (two) times daily as needed for anxiety (sedation precautions).    Dispense:  30 tablet    Refill:  0   No orders of the defined types were placed in this encounter.   Patient Instructions  Ok to get COVID vaccine.  Lorazepam refilled to use as needed.   Continue omeprazole every other day for now - try to taper off.  Try voltaren gel to left lateral lower leg - unsure what is causing discomfort.  I agree with 1 month trial of decreased responsibilities to only necessity, see how you do this month. We could consider trial of daily anxiety medicine like sertraline or celexa.    Follow up plan: Return if symptoms worsen or fail to improve.  Ria Bush, MD

## 2019-11-23 NOTE — Assessment & Plan Note (Signed)
So far reassuring cardiac workup including echo, cardiac calcium score of zero. If ongoing racing heart episodes could consider return to cardio to discuss possible event/holter monitor.

## 2019-12-01 ENCOUNTER — Telehealth: Payer: Self-pay

## 2019-12-01 ENCOUNTER — Encounter: Payer: Self-pay | Admitting: Family Medicine

## 2019-12-01 NOTE — Telephone Encounter (Signed)
Received faxed PA request for omeprazole 40 mg cap from [facility].  Submitted PA;  Key:  [id number].  Decision pending.

## 2019-12-04 MED ORDER — SERTRALINE HCL 25 MG PO TABS
25.0000 mg | ORAL_TABLET | Freq: Every day | ORAL | 6 refills | Status: DC
Start: 2019-12-04 — End: 2019-12-09

## 2019-12-04 NOTE — Telephone Encounter (Signed)
I don't understand - OTC omeprazole is 20mg , prescription I sent in was 40mg .  Please let patient know insurance wants him to take OTC omeprazole - but recommend he take 2 of the 20mg  tablets daily to reach prescribed 40mg  dose.

## 2019-12-04 NOTE — Telephone Encounter (Signed)
Received faxed PA denial.  Reason:  This health benefit plan does not cover the following services, supplies, drugs or charges:  Any drug that is therapeutically equivalent to  An OTC drug.  The OTC products contain the same active ingredients as the prescription product at the same, or similar, strengths.  FYI to Dr. Darnell Level.

## 2019-12-08 NOTE — Telephone Encounter (Signed)
Spoke with pt relaying Dr. Synthia Innocent message.  Pt verbalizes understanding and states he was aware of the denial.

## 2019-12-09 ENCOUNTER — Other Ambulatory Visit: Payer: Self-pay

## 2019-12-09 ENCOUNTER — Telehealth: Payer: Self-pay

## 2019-12-09 ENCOUNTER — Telehealth (INDEPENDENT_AMBULATORY_CARE_PROVIDER_SITE_OTHER): Payer: BC Managed Care – PPO | Admitting: Family Medicine

## 2019-12-09 ENCOUNTER — Encounter: Payer: Self-pay | Admitting: Family Medicine

## 2019-12-09 ENCOUNTER — Other Ambulatory Visit (INDEPENDENT_AMBULATORY_CARE_PROVIDER_SITE_OTHER): Payer: BC Managed Care – PPO

## 2019-12-09 VITALS — BP 130/76 | HR 87 | Temp 97.6°F | Ht 68.0 in | Wt 200.0 lb

## 2019-12-09 DIAGNOSIS — R12 Heartburn: Secondary | ICD-10-CM

## 2019-12-09 DIAGNOSIS — R1013 Epigastric pain: Secondary | ICD-10-CM | POA: Diagnosis not present

## 2019-12-09 DIAGNOSIS — R5383 Other fatigue: Secondary | ICD-10-CM | POA: Diagnosis not present

## 2019-12-09 DIAGNOSIS — R202 Paresthesia of skin: Secondary | ICD-10-CM | POA: Insufficient documentation

## 2019-12-09 DIAGNOSIS — F411 Generalized anxiety disorder: Secondary | ICD-10-CM | POA: Diagnosis not present

## 2019-12-09 LAB — FERRITIN: Ferritin: 245.6 ng/mL (ref 22.0–322.0)

## 2019-12-09 LAB — FOLATE: Folate: >24.8 ng/mL (ref >5.9)

## 2019-12-09 LAB — MAGNESIUM: Magnesium: 2.3 mg/dL (ref 1.5–2.5)

## 2019-12-09 LAB — VITAMIN B12: Vitamin B-12: 498 pg/mL (ref 211–911)

## 2019-12-09 MED ORDER — VENLAFAXINE HCL ER 37.5 MG PO CP24
37.5000 mg | ORAL_CAPSULE | Freq: Every day | ORAL | 3 refills | Status: DC
Start: 2019-12-09 — End: 2021-07-03

## 2019-12-09 NOTE — Telephone Encounter (Signed)
Pt has my chart video appt already scheduled today at 10 AM with Dr Darnell Level.

## 2019-12-09 NOTE — Assessment & Plan Note (Addendum)
Marked fatigue in association with malaise, body aches, chills, increased heart rate and pressure, GERD symptoms worse. Episodes seem to occur after a meal. Possibly worse with commencement of sertraline and dropping omeprazole to QOD - will rec stop sertraline as above, increase PPI back to daily dosing while we await to complete GI eval. Check b12, folate, ferritin. ?post-COVID fatigue.

## 2019-12-09 NOTE — Progress Notes (Signed)
Virtual visit completed through MyChart, a video enabled telemedicine application. Due to national recommendations of social distancing due to COVID-19, a virtual visit is felt to be most appropriate for this patient at this time. Reviewed limitations, risks, security and privacy concerns of performing a virtual visit and the availability of in person appointments. I also reviewed that there may be a patient responsible charge related to this service. The patient agreed to proceed.   Patient location: home Provider location: [place] at [facility], office Persons participating in this virtual visit: patient, provider   If any vitals were documented, they were collected by patient at home unless specified below.    BP 130/76   Pulse 87   Temp 97.6 F (36.4 C)   Ht 5\' 8"  (1.727 m)   Wt 200 lb (90.7 kg)   BMI 30.41 kg/m    CC: body aches  Subjective:    Patient ID: Christopher Contreras, male    DOB: 1956/01/03, 64 y.o.   MRN: [id number]  HPI: Christopher Contreras is a 64 y.o. male presenting on 12/09/2019 for Generalized Body Aches (C/o still having episodes of body aches, elevated BP, elevated HR, reflux.  Sxs to occur after eating.  Most recent episode, 12/05/19.  Had another episode this morning.   Seen for same sxs previously. )   See prior notes for details.  Saw cardiology. Calcium score of zero. Echo WNL. Abd  Saw GI - abd US WNL. EGD/colonoscopy planned Aug 3rd. ?biliary etiology. H pylori stool Ag negative.  Urine metanephrines/catecholamines normal Continues taking omeprazole 40mg , has backed down to every other day this past week.  Sertraline 25mg  recently started on Saturday as well as continued lorazepam BID PRN.   Ongoing episodes of fatigue - latest was Saturday night associated with marked malaise, high BP 150/90s, HR 126. Felt numbness to hands/feet, body aches, diaphoresis. Overall unwell feeling. Symptoms seem to flare after meals. Prior to this had been doing better.  This may have correlated to starting sertraline (took first dose Sat, took another dose yesterday) or to decreasing PPI frequency to QOD.  Tried to work 1/2 day Monday, unable to stay at work. Hasn't been able to return since.   Never smoker.  H/o COVID-19 05/2019. Intermittent above symptoms since then.      Relevant past medical, surgical, family and social history reviewed and updated as indicated. Interim medical history since our last visit reviewed. Allergies and medications reviewed and updated. Outpatient Medications Prior to Visit  Medication Sig Dispense Refill  . LORazepam (ATIVAN) 0.5 MG tablet Take 1 tablet (0.5 mg total) by mouth 2 (two) times daily as needed for anxiety (sedation precautions). 30 tablet 0  . omeprazole (PRILOSEC) 40 MG capsule Take 1 capsule (40 mg total) by mouth daily. For 3 weeks then as needed 30 capsule 3  . sertraline (ZOLOFT) 25 MG tablet Take 1 tablet (25 mg total) by mouth daily. 30 tablet 6   No facility-administered medications prior to visit.     Per HPI unless specifically indicated in ROS section below Review of Systems Objective:  BP 130/76   Pulse 87   Temp 97.6 F (36.4 C)   Ht 5\' 8"  (1.727 m)   Wt 200 lb (90.7 kg)   BMI 30.41 kg/m   Wt Readings from Last 3 Encounters:  12/09/19 200 lb (90.7 kg)  11/23/19 205 lb 5 oz (93.1 kg)  11/18/19 201 lb 12.8 oz (91.5 kg)  Physical exam: Gen: alert, NAD, not ill appearing Pulm: speaks in complete sentences without increased work of breathing Psych: normal mood, normal thought content      Results for orders placed or performed in visit on 47/65/46  Helicobacter pylori special antigen   Specimen: Stool  Result Value Ref Range   MICRO NUMBER: [id number]    SPECIMEN QUALITY Adequate    SOURCE: STOOL    STATUS: FINAL    RESULT:      Not Detected  Antimicrobials, proton pump inhibitors, and bismuth preparations inhibit H. pylori and ingestion up to two weeks prior to testing may  cause false negative results. If clinically indicated the test should be repeated on a new specimen  obtained two weeks after discontinuing treatment.   Metanephrines, urine, 24 hour  Result Value Ref Range   Volume, Urine-VMAUR 2,400 mL   Metanephrines, Ur 82 (L) 90 - 315 mcg/24 h   Normetanephrine, 24H Ur 266 122 - 676 mcg/24 h   Metaneph Total, Ur 348 224 - 832 mcg/24 h  Catecholamines, fractionated, urine, 24 hour  Result Value Ref Range   Total Volume 2,400 mL   Epinephrine, 24H, Ur see note mcg/24 h   Norepinephrine, 24H, Ur 38 15 - 100 mcg/24 h   Calc Total (E+NE) 38 26 - 121 mcg/24 h   Dopamine 24 Hr Urine 270 52 - 480 mcg/24 h   Creatinine, Urine mg/day-CATEUR 1.79 0.50 - 2.15 g/24 h   Assessment & Plan:   Problem List Items Addressed This Visit    Paresthesia    Newly noted - will return for labs to check B12, folate       Relevant Orders   Vitamin B12   Folate   Heartburn    Return to QD dosing.       Fatigue - Primary    Marked fatigue in association with malaise, body aches, chills, increased heart rate and pressure, GERD symptoms worse. Episodes seem to occur after a meal. Possibly worse with commencement of sertraline and dropping omeprazole to QOD - will rec stop sertraline as above, increase PPI back to daily dosing while we await to complete GI eval. Check b12, folate, ferritin. ?post-COVID fatigue.       Relevant Orders   Vitamin B12   Ferritin   Folate   Magnesium   Anxiety state    Stress/anxiety compounds symptoms - finds lorazepam has been beneficial to help control symptoms so will continue. Took 1-2 doses of sertraline, may have worsened symptoms. Advised stop sertraline, trial effexor XR 37.5mg  daily. Discussed need to be on medication for ~4 wks to determine full effect. Update with questions.       Relevant Medications   venlafaxine XR (EFFEXOR XR) 37.5 MG 24 hr capsule   Abdominal discomfort, epigastric    H pylori stool Ag negative, recent  abd [place] reassuring. Continue daily PPI. Pending EGD/colonoscopy.           Meds ordered this encounter  Medications  . venlafaxine XR (EFFEXOR XR) 37.5 MG 24 hr capsule    Sig: Take 1 capsule (37.5 mg total) by mouth daily with breakfast.    Dispense:  30 capsule    Refill:  3    To replace sertraline   Orders Placed This Encounter  Procedures  . Vitamin B12    Standing Status:   Future    Number of Occurrences:   1    Standing Expiration Date:   12/08/2020  .  Ferritin    Standing Status:   Future    Number of Occurrences:   1    Standing Expiration Date:   12/08/2020  . Folate    Standing Status:   Future    Number of Occurrences:   1    Standing Expiration Date:   12/08/2020  . Magnesium    Standing Status:   Future    Number of Occurrences:   1    Standing Expiration Date:   12/08/2020    I discussed the assessment and treatment plan with the patient. The patient was provided an opportunity to ask questions and all were answered. The patient agreed with the plan and demonstrated an understanding of the instructions. The patient was advised to call back or seek an in-person evaluation if the symptoms worsen or if the condition fails to improve as anticipated.  Patient Instructions  Schedule labs Restart omeprazole 40mg  daily.  Stop sertraline 25mg . Start effexor XR 37.5mg  daily.  Update with effect.    Follow up plan: Return if symptoms worsen or fail to improve.  Ria Bush, MD

## 2019-12-09 NOTE — Assessment & Plan Note (Signed)
H pylori stool Ag negative, recent abd [place] reassuring. Continue daily PPI. Pending EGD/colonoscopy.

## 2019-12-09 NOTE — Assessment & Plan Note (Addendum)
Newly noted - will return for labs to check B12, folate

## 2019-12-09 NOTE — Assessment & Plan Note (Addendum)
Stress/anxiety compounds symptoms - finds lorazepam has been beneficial to help control symptoms so will continue. Took 1-2 doses of sertraline, may have worsened symptoms. Advised stop sertraline, trial effexor XR 37.5mg  daily. Discussed need to be on medication for ~4 wks to determine full effect. Update with questions.

## 2019-12-09 NOTE — Patient Instructions (Addendum)
Schedule labs Restart omeprazole 40mg  daily.  Stop sertraline 25mg . Start effexor XR 37.5mg  daily.  Update with effect.

## 2019-12-09 NOTE — Assessment & Plan Note (Addendum)
Return to QD dosing.

## 2019-12-09 NOTE — Telephone Encounter (Signed)
[facility] - Client TELEPHONE ADVICE RECORD [facility] Patient Name: Christopher Contreras Gender: Male DOB: [birth year] Age: 64 Y 21 M 1 D Return Phone Number: [phone] (Primary), [phone] (Secondary) Address: City/State/Zip: [place] [address] Client [facility] - Client Client Site [place] - Day Physician Ria Bush - MD Contact Type Call Who Is Calling Patient / Member / Family / Caregiver Call Type Triage / Clinical Relationship To Patient Self Return Phone Number [phone] (Primary) Chief Complaint BREATHING - shortness of breath or sounds breathless Reason for Call Symptomatic / Request for Health Information Initial Comment caller states he has weakness, numbness, shortness of breath that comes and goes, fever, and chills. he is struggling to keep food down. he has tested positive for covid in the past and he got his first vaccine. he's had these symptoms since he first got covid. Translation No Nurse Assessment Nurse: Micki Riley, RN, Domenick Gong Date/Time (Eastern Time): 12/09/2019 8:19:39 AM Confirm and document reason for call. If symptomatic, describe symptoms. ---Caller states he was COVID+ January 2021. Has has occasional symptoms since March. These symptoms started again this past Saturday: weakness, occasional shortness of breath, occasional bilateral numbness of hands & feet (not present now), denies any fever but feels sweaty & clammy at times. States he had his first COVID vaccine 2 weeks ago. Has the patient had close contact with a person known or suspected to have the novel coronavirus illness OR traveled / lives in area with major community spread (including international travel) in the last 14 days from the onset of symptoms? * If Asymptomatic, screen for exposure and travel within the last 14 days. ---No Does the patient have any new or worsening  symptoms? ---Yes Will a triage be completed? ---Yes Related visit to physician within the last 2 weeks? ---N/A Does the PT have any chronic conditions? (i.e. diabetes, asthma, this includes High risk factors for pregnancy, etc.) ---Unknown Is this a behavioral health or substance abuse call? ---No PLEASE NOTE: All timestamps contained within this report are represented as [place] Standard Time. CONFIDENTIALTY NOTICE: This fax transmission is intended only for the addressee. It contains information that is legally privileged, confidential or otherwise protected from use or disclosure. If you are not the intended recipient, you are strictly prohibited from reviewing, disclosing, copying using or disseminating any of this information or taking any action in reliance on or regarding this information. If you have received this fax in error, please notify us immediately by telephone so that we can arrange for its return to [place]. Phone: [phone], Toll-Free: [phone], Fax: [phone] Page: 2 of 2 Call Id: [id number] Guidelines Guideline Title Affirmed Question Affirmed Notes Nurse Date/Time Eilene Ghazi Time) COVID-19 - Persisting Symptoms Follow-up Call [1] PERSISTING SYMPTOMS OF COVID-19 AND [2] symptoms Pearline Cables, RN, Domenick Gong 12/09/2019 8:20:37 AM Disp. Time Eilene Ghazi Time) Disposition Final User 12/09/2019 8:16:24 AM Send to Urgent Queue Phillips Hay 12/09/2019 8:30:07 AM See PCP within 24 Hours Yes Cazares, RN, Domenick Gong Caller Disagree/Comply Comply Caller Understands Yes PreDisposition Go to ED Care Advice Given Per Guideline SEE PCP WITHIN 24 HOURS: CALL BACK IF: * You become worse. CARE ADVICE given per COVID-19 - Persisting Symptoms Follow-Up Call (Adult) guideline. Referrals REFERRED TO PCP OFFICE

## 2019-12-14 DIAGNOSIS — C61 Malignant neoplasm of prostate: Secondary | ICD-10-CM | POA: Diagnosis not present

## 2019-12-16 DIAGNOSIS — N5201 Erectile dysfunction due to arterial insufficiency: Secondary | ICD-10-CM | POA: Diagnosis not present

## 2019-12-16 DIAGNOSIS — C61 Malignant neoplasm of prostate: Secondary | ICD-10-CM | POA: Diagnosis not present

## 2019-12-24 DIAGNOSIS — C44212 Basal cell carcinoma of skin of right ear and external auricular canal: Secondary | ICD-10-CM | POA: Diagnosis not present

## 2019-12-28 DIAGNOSIS — Z20822 Contact with and (suspected) exposure to covid-19: Secondary | ICD-10-CM | POA: Diagnosis not present

## 2019-12-28 DIAGNOSIS — J019 Acute sinusitis, unspecified: Secondary | ICD-10-CM | POA: Diagnosis not present

## 2020-01-14 DIAGNOSIS — C44212 Basal cell carcinoma of skin of right ear and external auricular canal: Secondary | ICD-10-CM | POA: Diagnosis not present

## 2020-02-25 ENCOUNTER — Encounter: Payer: BC Managed Care – PPO | Admitting: Dermatology

## 2020-02-26 HISTORY — PX: COLONOSCOPY: SHX174

## 2020-02-26 HISTORY — PX: ESOPHAGOGASTRODUODENOSCOPY: SHX1529

## 2020-03-08 ENCOUNTER — Encounter: Payer: Self-pay | Admitting: Family Medicine

## 2020-03-08 DIAGNOSIS — K635 Polyp of colon: Secondary | ICD-10-CM | POA: Diagnosis not present

## 2020-03-08 DIAGNOSIS — D122 Benign neoplasm of ascending colon: Secondary | ICD-10-CM | POA: Diagnosis not present

## 2020-03-08 DIAGNOSIS — R1013 Epigastric pain: Secondary | ICD-10-CM | POA: Diagnosis not present

## 2020-03-08 DIAGNOSIS — K449 Diaphragmatic hernia without obstruction or gangrene: Secondary | ICD-10-CM | POA: Diagnosis not present

## 2020-03-08 DIAGNOSIS — Z1211 Encounter for screening for malignant neoplasm of colon: Secondary | ICD-10-CM | POA: Diagnosis not present

## 2020-03-08 DIAGNOSIS — Z800 Family history of malignant neoplasm of digestive organs: Secondary | ICD-10-CM | POA: Diagnosis not present

## 2020-03-08 DIAGNOSIS — Z8371 Family history of colonic polyps: Secondary | ICD-10-CM | POA: Diagnosis not present

## 2020-03-15 ENCOUNTER — Encounter: Payer: Self-pay | Admitting: Family Medicine

## 2020-04-11 ENCOUNTER — Encounter: Payer: Self-pay | Admitting: Dermatology

## 2020-04-11 ENCOUNTER — Other Ambulatory Visit: Payer: Self-pay

## 2020-04-11 ENCOUNTER — Ambulatory Visit (INDEPENDENT_AMBULATORY_CARE_PROVIDER_SITE_OTHER): Payer: BC Managed Care – PPO | Admitting: Dermatology

## 2020-04-11 DIAGNOSIS — L578 Other skin changes due to chronic exposure to nonionizing radiation: Secondary | ICD-10-CM

## 2020-04-11 DIAGNOSIS — D229 Melanocytic nevi, unspecified: Secondary | ICD-10-CM

## 2020-04-11 DIAGNOSIS — Z1283 Encounter for screening for malignant neoplasm of skin: Secondary | ICD-10-CM | POA: Diagnosis not present

## 2020-04-11 DIAGNOSIS — L821 Other seborrheic keratosis: Secondary | ICD-10-CM

## 2020-04-11 DIAGNOSIS — L814 Other melanin hyperpigmentation: Secondary | ICD-10-CM | POA: Diagnosis not present

## 2020-04-11 DIAGNOSIS — Z85828 Personal history of other malignant neoplasm of skin: Secondary | ICD-10-CM

## 2020-04-11 DIAGNOSIS — D1800 Hemangioma unspecified site: Secondary | ICD-10-CM

## 2020-04-11 NOTE — Progress Notes (Signed)
   Follow-Up Visit   Subjective  Christopher Contreras is a 64 y.o. male who presents for the following: Annual Exam (Hx BCC ). The patient presents for Total-Body Skin Exam (TBSE) for skin cancer screening and mole check.  The following portions of the chart were reviewed this encounter and updated as appropriate:  Tobacco  Allergies  Meds  Problems  Med Hx  Surg Hx  Fam Hx     Review of Systems:  No other skin or systemic complaints except as noted in HPI or Assessment and Plan.  Objective  Well appearing patient in no apparent distress; mood and affect are within normal limits.  A full examination was performed including scalp, head, eyes, ears, nose, lips, neck, chest, axillae, abdomen, back, buttocks, bilateral upper extremities, bilateral lower extremities, hands, feet, fingers, toes, fingernails, and toenails. All findings within normal limits unless otherwise noted below.   Assessment & Plan    Lentigines - Scattered tan macules - Discussed due to sun exposure - Benign, observe - Call for any changes  Seborrheic Keratoses - Stuck-on, waxy, tan-brown papules and plaques  - Discussed benign etiology and prognosis. - Observe - Call for any changes  Melanocytic Nevi - Tan-brown and/or pink-flesh-colored symmetric macules and papules - Benign appearing on exam today - Observation - Call clinic for new or changing moles - Recommend daily use of broad spectrum spf 30+ sunscreen to sun-exposed areas.   Hemangiomas - Red papules - Discussed benign nature - Observe - Call for any changes  Actinic Damage - Chronic, secondary to cumulative UV/sun exposure - diffuse scaly erythematous macules with underlying dyspigmentation - Recommend daily broad spectrum sunscreen SPF 30+ to sun-exposed areas, reapply every 2 hours as needed.  - Call for new or changing lesions.  History of Basal Cell Carcinoma of the Skin -face and ear - No evidence of recurrence today - Recommend  regular full body skin exams - Recommend daily broad spectrum sunscreen SPF 30+ to sun-exposed areas, reapply every 2 hours as needed.  - Call if any new or changing lesions are noted between office visits  Skin cancer screening performed today.  Return in about 1 year (around 04/11/2021) for TBSE.  Luther Redo, CMA, am acting as scribe for Sarina Ser, MD .  Documentation: I have reviewed the above documentation for accuracy and completeness, and I agree with the above.  Sarina Ser, MD

## 2020-04-13 ENCOUNTER — Encounter: Payer: Self-pay | Admitting: Dermatology

## 2020-07-04 DIAGNOSIS — C61 Malignant neoplasm of prostate: Secondary | ICD-10-CM | POA: Diagnosis not present

## 2020-07-13 DIAGNOSIS — C61 Malignant neoplasm of prostate: Secondary | ICD-10-CM | POA: Diagnosis not present

## 2020-08-25 DIAGNOSIS — J069 Acute upper respiratory infection, unspecified: Secondary | ICD-10-CM | POA: Diagnosis not present

## 2021-02-01 DIAGNOSIS — C61 Malignant neoplasm of prostate: Secondary | ICD-10-CM | POA: Diagnosis not present

## 2021-02-08 DIAGNOSIS — C61 Malignant neoplasm of prostate: Secondary | ICD-10-CM | POA: Diagnosis not present

## 2021-02-08 DIAGNOSIS — N5201 Erectile dysfunction due to arterial insufficiency: Secondary | ICD-10-CM | POA: Diagnosis not present

## 2021-04-04 IMAGING — CT CT HEART MORP W/ CTA COR W/ SCORE W/ CA W/CM &/OR W/O CM
4 of 7 series · 8 of 20 positions shown, 9 images · non-contrast
Comparison: None.
COMPARISON: None.

Addendum:
EXAM:
OVER-READ INTERPRETATION  CT CHEST

The following report is an over-read performed by radiologist Dr.
Nils Laborde [REDACTED] on 11/11/2019. This over-read
does not include interpretation of cardiac or coronary anatomy or
pathology. The coronary CTA interpretation by the cardiologist is
attached.
CLINICAL DATA: 63-year-old male with hypertension and chest pain.
Cardiac/Coronary  CTA
TECHNIQUE: The patient was scanned on a [employer] Force scanner.

[Series 6: best diast 73 % · axial · 0.39mm/px · z∈[-116,-79]mm · 2 of 283 slices shown, 3 images]
[im 95/283  vessel]
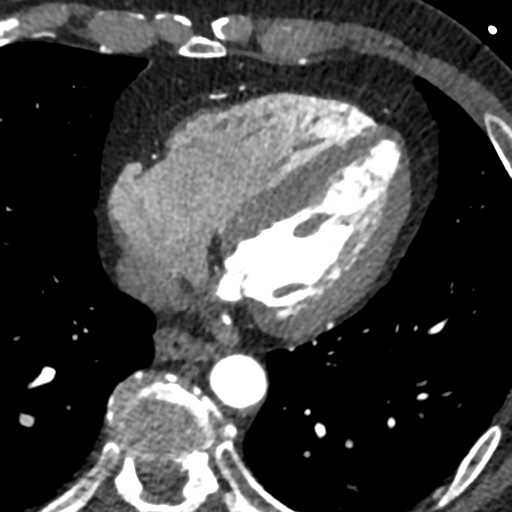
[im 95/283  lung]
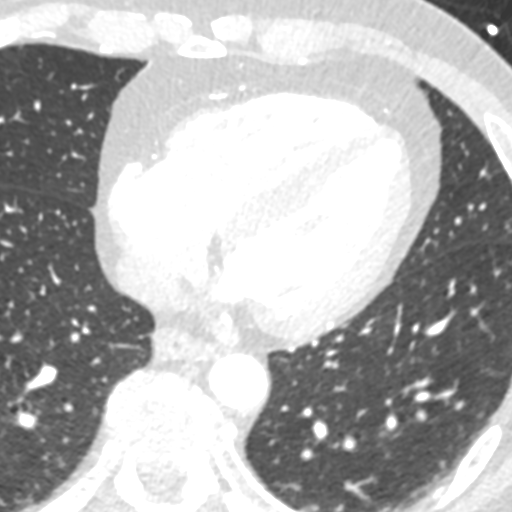
[im 189/283  vessel]
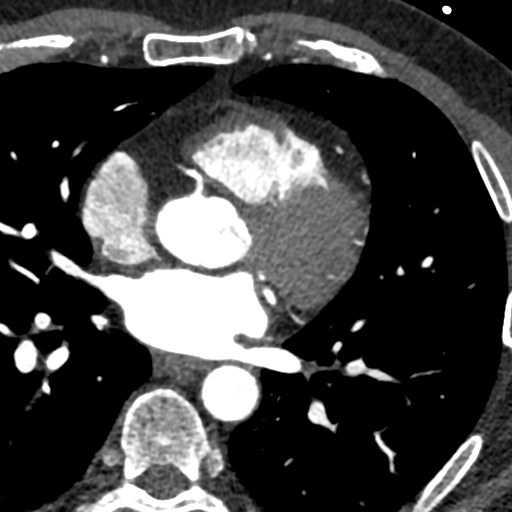

[Series 7: best syst 32 % · axial · 0.39mm/px · z∈[-116,-79]mm · 2 of 283 slices shown]
[im 95/283  vessel]
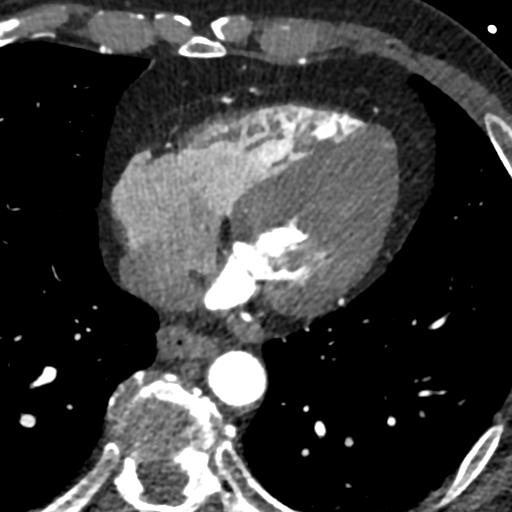
[im 189/283  vessel]
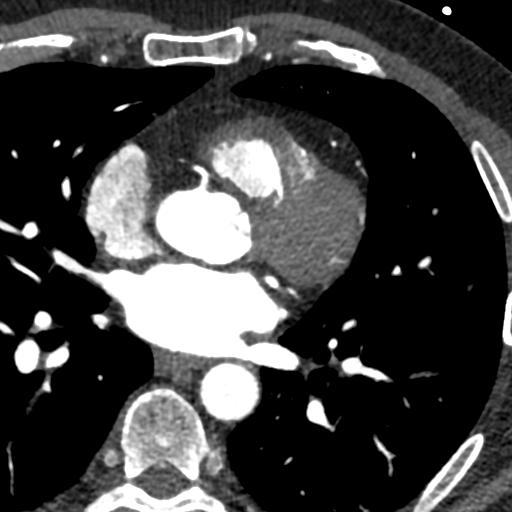

[Series 8: ts diast sharp 73 % · axial · 0.39mm/px · z∈[-116,-79]mm · 2 of 283 slices shown]
[im 95/283  lung]
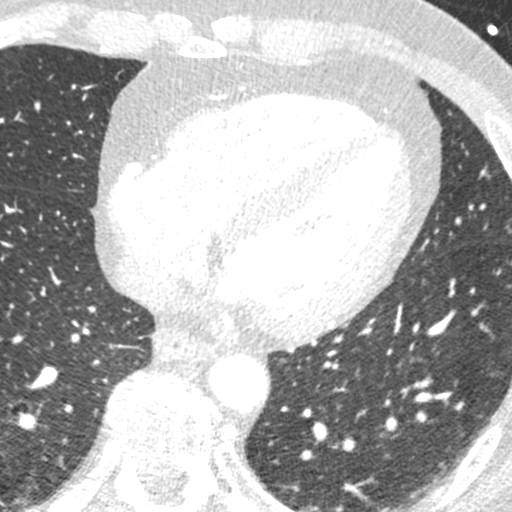
[im 189/283  lung]
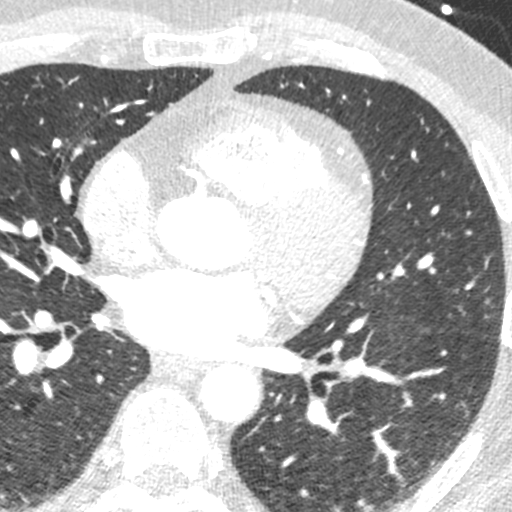

[Series 9: ts syst sharp 32 % · axial · 0.39mm/px · z∈[-116,-79]mm · 2 of 283 slices shown]
[im 95/283  lung]
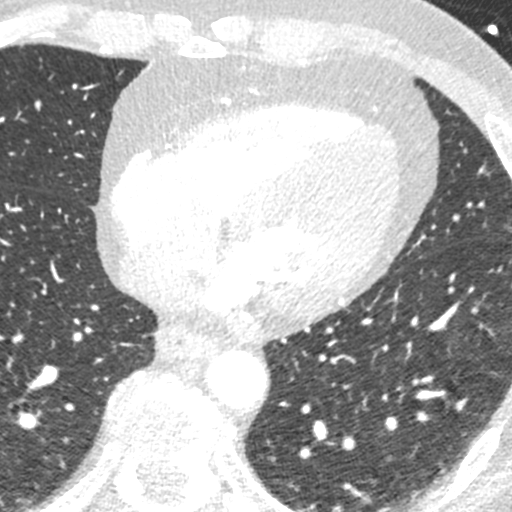
[im 189/283  lung]
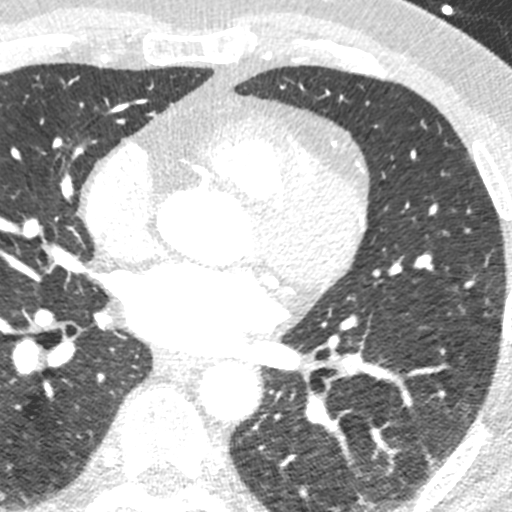

[8 of 20 positions shown; findings below may reference images not displayed]

FINDINGS: Vascular: Heart is normal size.  Aorta normal caliber.

Mediastinum/Nodes: No adenopathy.  Calcified left hilar lymph nodes.

Lungs/Pleura: No confluent opacities or effusions.

Upper Abdomen: Imaging into the upper abdomen shows no acute
findings.

Musculoskeletal: Chest wall soft tissues are unremarkable. No acute
bony abnormality.
IMPRESSION: Old granulomatous disease.  No acute findings.
FINDINGS: A 100 kV prospective scan was triggered in the descending thoracic
aorta at 111 HU's. Axial non-contrast 3 mm slices were carried out
through the heart. The data set was analyzed on a dedicated work
station and scored using the Agatson method. Gantry rotation speed
was 250 msecs and collimation was .6 mm. 100 mg of PO Metoprolol and
0.8 mg of sl NTG was given. The 3D data set was reconstructed in 5%
intervals of the 67-82 % of the R-R cycle. Diastolic phases were
analyzed on a dedicated work station using MPR, MIP and VRT modes.
The patient received 80 cc of contrast.

Aorta: Normal size. Mild diffuse atherosclerotic plaque and
calcifications. No dissection.

Aortic Valve:  Trileaflet.  Trivial calcifications.

Coronary Arteries:  Normal coronary origin.  Left dominance.

RCA is a medium lumen non-dominant artery that has no plaque.

Left main is a short artery that gives rise to LAD and LCX arteries.
Left main has no plaque.

LAD is a large vessel that gives rise to two large diagonal arteries
and has no plaque.

D1,2 are large arteries with no plaque.

LCX is a large dominant artery that gives rise to two OM branches
and PDA. PLA is not visualized. There is no significant plaque.

Other findings:

Normal pulmonary vein drainage into the left atrium.

Normal left atrial appendage without a thrombus.

Normal size of the pulmonary artery.
IMPRESSION: 1. Coronary calcium score of 0. This was 0 percentile for age and
sex matched control.

2. Normal coronary origin with left dominance.

3. CAD-RADS 0. No evidence of CAD (0%). Consider non-atherosclerotic
causes of chest pain.

*** End of Addendum ***
EXAM:
OVER-READ INTERPRETATION  CT CHEST

The following report is an over-read performed by radiologist Dr.
Nils Laborde [REDACTED] on 11/11/2019. This over-read
does not include interpretation of cardiac or coronary anatomy or
pathology. The coronary CTA interpretation by the cardiologist is
attached.
FINDINGS: Vascular: Heart is normal size.  Aorta normal caliber.

Mediastinum/Nodes: No adenopathy.  Calcified left hilar lymph nodes.

Lungs/Pleura: No confluent opacities or effusions.

Upper Abdomen: Imaging into the upper abdomen shows no acute
findings.

Musculoskeletal: Chest wall soft tissues are unremarkable. No acute
bony abnormality.
IMPRESSION: Old granulomatous disease.  No acute findings.

## 2021-04-13 ENCOUNTER — Ambulatory Visit (INDEPENDENT_AMBULATORY_CARE_PROVIDER_SITE_OTHER): Payer: BC Managed Care – PPO | Admitting: Dermatology

## 2021-04-13 ENCOUNTER — Other Ambulatory Visit: Payer: Self-pay

## 2021-04-13 DIAGNOSIS — D1801 Hemangioma of skin and subcutaneous tissue: Secondary | ICD-10-CM

## 2021-04-13 DIAGNOSIS — L814 Other melanin hyperpigmentation: Secondary | ICD-10-CM

## 2021-04-13 DIAGNOSIS — L578 Other skin changes due to chronic exposure to nonionizing radiation: Secondary | ICD-10-CM | POA: Diagnosis not present

## 2021-04-13 DIAGNOSIS — L570 Actinic keratosis: Secondary | ICD-10-CM

## 2021-04-13 DIAGNOSIS — L821 Other seborrheic keratosis: Secondary | ICD-10-CM

## 2021-04-13 DIAGNOSIS — Z1283 Encounter for screening for malignant neoplasm of skin: Secondary | ICD-10-CM

## 2021-04-13 DIAGNOSIS — D229 Melanocytic nevi, unspecified: Secondary | ICD-10-CM

## 2021-04-13 DIAGNOSIS — Z85828 Personal history of other malignant neoplasm of skin: Secondary | ICD-10-CM

## 2021-04-13 NOTE — Progress Notes (Signed)
   Follow-Up Visit   Subjective  Christopher Contreras is a 65 y.o. male who presents for the following: Annual Exam (History of BCC - TBSE today). The patient presents for Total-Body Skin Exam (TBSE) for skin cancer screening and mole check.  The patient has spots, moles and lesions to be evaluated, some may be new or changing and the patient has concerns that these could be cancer.  The following portions of the chart were reviewed this encounter and updated as appropriate:   Tobacco  Allergies  Meds  Problems  Med Hx  Surg Hx  Fam Hx     Review of Systems:  No other skin or systemic complaints except as noted in HPI or Assessment and Plan.  Objective  Well appearing patient in no apparent distress; mood and affect are within normal limits.  A full examination was performed including scalp, head, eyes, ears, nose, lips, neck, chest, axillae, abdomen, back, buttocks, bilateral upper extremities, bilateral lower extremities, hands, feet, fingers, toes, fingernails, and toenails. All findings within normal limits unless otherwise noted below.  Right cheek Erythematous thin papules/macules with gritty scale.    Assessment & Plan   History of Basal Cell Carcinoma of the Skin - No evidence of recurrence today - Recommend regular full body skin exams - Recommend daily broad spectrum sunscreen SPF 30+ to sun-exposed areas, reapply every 2 hours as needed.  - Call if any new or changing lesions are noted between office visits  Lentigines - Scattered tan macules - Due to sun exposure - Benign-appearing, observe - Recommend daily broad spectrum sunscreen SPF 30+ to sun-exposed areas, reapply every 2 hours as needed. - Call for any changes  Seborrheic Keratoses - Stuck-on, waxy, tan-brown papules and/or plaques  - Benign-appearing - Discussed benign etiology and prognosis. - Observe - Call for any changes  Melanocytic Nevi - Tan-brown and/or pink-flesh-colored symmetric macules and  papules - Benign appearing on exam today - Observation - Call clinic for new or changing moles - Recommend daily use of broad spectrum spf 30+ sunscreen to sun-exposed areas.   Hemangiomas - Red papules - Discussed benign nature - Observe - Call for any changes  Actinic Damage - Chronic condition, secondary to cumulative UV/sun exposure - diffuse scaly erythematous macules with underlying dyspigmentation - Recommend daily broad spectrum sunscreen SPF 30+ to sun-exposed areas, reapply every 2 hours as needed.  - Staying in the shade or wearing long sleeves, sun glasses (UVA+UVB protection) and wide brim hats (4-inch brim around the entire circumference of the hat) are also recommended for sun protection.  - Call for new or changing lesions.  Skin cancer screening performed today.  AK (actinic keratosis) Right cheek  Destruction of lesion - Right cheek Complexity: simple   Destruction method: cryotherapy   Informed consent: discussed and consent obtained   Timeout:  patient name, date of birth, surgical site, and procedure verified Lesion destroyed using liquid nitrogen: Yes   Region frozen until ice ball extended beyond lesion: Yes   Outcome: patient tolerated procedure well with no complications   Post-procedure details: wound care instructions given    Skin cancer screening  Return in about 1 year (around 04/13/2022) for TBSE.  I, Ashok Cordia, CMA, am acting as scribe for Sarina Ser, MD . Documentation: I have reviewed the above documentation for accuracy and completeness, and I agree with the above.  Sarina Ser, MD

## 2021-04-13 NOTE — Patient Instructions (Signed)

## 2021-04-28 ENCOUNTER — Encounter: Payer: Self-pay | Admitting: Dermatology

## 2021-06-08 DIAGNOSIS — J208 Acute bronchitis due to other specified organisms: Secondary | ICD-10-CM | POA: Diagnosis not present

## 2021-06-08 DIAGNOSIS — R059 Cough, unspecified: Secondary | ICD-10-CM | POA: Diagnosis not present

## 2021-06-08 DIAGNOSIS — B9689 Other specified bacterial agents as the cause of diseases classified elsewhere: Secondary | ICD-10-CM | POA: Diagnosis not present

## 2021-06-08 DIAGNOSIS — Z20822 Contact with and (suspected) exposure to covid-19: Secondary | ICD-10-CM | POA: Diagnosis not present

## 2021-07-03 ENCOUNTER — Other Ambulatory Visit: Payer: Self-pay

## 2021-07-03 ENCOUNTER — Ambulatory Visit (INDEPENDENT_AMBULATORY_CARE_PROVIDER_SITE_OTHER): Payer: BC Managed Care – PPO | Admitting: Family Medicine

## 2021-07-03 ENCOUNTER — Encounter: Payer: Self-pay | Admitting: Family Medicine

## 2021-07-03 VITALS — BP 124/78 | HR 77 | Temp 97.7°F | Ht 68.0 in | Wt 208.0 lb

## 2021-07-03 DIAGNOSIS — F411 Generalized anxiety disorder: Secondary | ICD-10-CM | POA: Diagnosis not present

## 2021-07-03 DIAGNOSIS — U099 Post covid-19 condition, unspecified: Secondary | ICD-10-CM

## 2021-07-03 DIAGNOSIS — Z8546 Personal history of malignant neoplasm of prostate: Secondary | ICD-10-CM

## 2021-07-03 DIAGNOSIS — K219 Gastro-esophageal reflux disease without esophagitis: Secondary | ICD-10-CM

## 2021-07-03 DIAGNOSIS — R0602 Shortness of breath: Secondary | ICD-10-CM

## 2021-07-03 DIAGNOSIS — E785 Hyperlipidemia, unspecified: Secondary | ICD-10-CM | POA: Diagnosis not present

## 2021-07-03 DIAGNOSIS — G9332 Myalgic encephalomyelitis/chronic fatigue syndrome: Secondary | ICD-10-CM | POA: Diagnosis not present

## 2021-07-03 DIAGNOSIS — R0789 Other chest pain: Secondary | ICD-10-CM | POA: Diagnosis not present

## 2021-07-03 LAB — COMPREHENSIVE METABOLIC PANEL
ALT: 26 U/L (ref 0–53)
AST: 20 U/L (ref 0–37)
Albumin: 4.7 g/dL (ref 3.5–5.2)
Alkaline Phosphatase: 60 U/L (ref 39–117)
BUN: 16 mg/dL (ref 6–23)
CO2: 31 mEq/L (ref 19–32)
Calcium: 9.5 mg/dL (ref 8.4–10.5)
Chloride: 104 mEq/L (ref 96–112)
Creatinine, Ser: 1.15 mg/dL (ref 0.40–1.50)
GFR: 67.86 mL/min (ref >60.00)
Glucose, Bld: 110 mg/dL — ABNORMAL HIGH (ref 70–99)
Potassium: 4.7 mEq/L (ref 3.5–5.1)
Sodium: 141 mEq/L (ref 135–145)
Total Bilirubin: 2.5 mg/dL — ABNORMAL HIGH (ref 0.2–1.2)
Total Protein: 6.9 g/dL (ref 6.0–8.3)

## 2021-07-03 LAB — CBC WITH DIFFERENTIAL/PLATELET
Basophils Absolute: 0 10*3/uL (ref 0.0–0.1)
Basophils Relative: 0.7 % (ref 0.0–3.0)
Eosinophils Absolute: 0.1 10*3/uL (ref 0.0–0.7)
Eosinophils Relative: 2.5 % (ref 0.0–5.0)
HCT: 47.1 % (ref 39.0–52.0)
Hemoglobin: 16.1 g/dL (ref 13.0–17.0)
Lymphocytes Relative: 35.5 % (ref 12.0–46.0)
Lymphs Abs: 1.8 10*3/uL (ref 0.7–4.0)
MCHC: 34.1 g/dL (ref 30.0–36.0)
MCV: 89.9 fl (ref 78.0–100.0)
Monocytes Absolute: 0.4 10*3/uL (ref 0.1–1.0)
Monocytes Relative: 9 % (ref 3.0–12.0)
Neutro Abs: 2.6 10*3/uL (ref 1.4–7.7)
Neutrophils Relative %: 52.3 % (ref 43.0–77.0)
Platelets: 228 10*3/uL (ref 150.0–400.0)
RBC: 5.24 Mil/uL (ref 4.22–5.81)
RDW: 13.2 % (ref 11.5–15.5)
WBC: 5 10*3/uL (ref 4.0–10.5)

## 2021-07-03 LAB — LIPID PANEL
Cholesterol: 226 mg/dL — ABNORMAL HIGH (ref 0–200)
HDL: 42.3 mg/dL (ref >39.00)
LDL Cholesterol: 168 mg/dL — ABNORMAL HIGH (ref 0–99)
NonHDL: 183.59
Total CHOL/HDL Ratio: 5
Triglycerides: 79 mg/dL (ref 0.0–149.0)
VLDL: 15.8 mg/dL (ref 0.0–40.0)

## 2021-07-03 LAB — IBC PANEL
Iron: 103 ug/dL (ref 42–165)
Saturation Ratios: 28.6 % (ref 20.0–50.0)
TIBC: 359.8 ug/dL (ref 250.0–450.0)
Transferrin: 257 mg/dL (ref 212.0–360.0)

## 2021-07-03 LAB — FERRITIN: Ferritin: 208.2 ng/mL (ref 22.0–322.0)

## 2021-07-03 LAB — T4, FREE: Free T4: 0.97 ng/dL (ref 0.60–1.60)

## 2021-07-03 LAB — C-REACTIVE PROTEIN: CRP: <1 mg/dL (ref 0.5–20.0)

## 2021-07-03 LAB — TSH: TSH: 1.06 u[IU]/mL (ref 0.35–5.50)

## 2021-07-03 LAB — SEDIMENTATION RATE: Sed Rate: 5 mm/hr (ref 0–20)

## 2021-07-03 MED ORDER — CETIRIZINE HCL 10 MG PO TABS: 10.0000 mg | ORAL_TABLET | Freq: Every day | ORAL | 11 refills | Status: DC

## 2021-07-03 NOTE — Progress Notes (Signed)
Patient ID: Christopher Contreras, male    DOB: Oct 25, 1955, 66 y.o.   MRN: [id number]  This visit was conducted in person.  BP 124/78    Pulse 77    Temp 97.7 F (36.5 C) (Temporal)    Ht 5\' 8"  (1.727 m)    Wt 208 lb (94.3 kg)    SpO2 98%    BMI 31.63 kg/m    CC: chest pain  Subjective:   HPI: Christopher Contreras is a 66 y.o. male presenting on 07/03/2021 for Chest Pain (C/o chest pain.  Started about 1 wk ago.  Has been feeling bad since having COVID about 2 yrs ago. )   Hasn't felt well since [place] infection 05/2019. Notes ongoing fatigue, exercise intolerance, shortness of breath, brain fog with memory trouble, heat intolerance, trouble with poor sleep, altered taste/smell (better than at time of COVID but never back to normal), anxiety/depression.  Notes easy tachy palpitations with minimal exertion ie walking to the mailbox - worse with hot temperatures.  Over the past week felt sharp substernal chest pain, not exertional or relieved by rest.  Over last few weeks notes dull bitemporal headache  Currently off BP medication.  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   Previous cardiac evaluation ([facility]) included echo 09/2019 LVEF 60-65%, no valve disease and coronary CTA 10/2019 without evidence of CAD with calcium score of zero. At that time thought non-cardiac chest pain. Last saw cardiology 10/2019.   Other workup has included normal urine metanephrines and catecholamines, normal vitamins, neg H pylori stool Ag, normal lipase, CBC, CMP.   Did not feel well on trial of lorazepam or venlafaxine.  He takes zyrtec and omeprazole - only think he's noted has helped.  Prostate cancer s/p prostatectomy 2020.   Also on long list of supplements including A, B complex, C, D3, K2, CoQ10, Mg, udo's oil 3-6-9, N-Acetyl Cysteine, melatonin (no benefit), quercetin w/ bromelaw, histamine digest, aspirin and Lifewave X39  Patches.  Most recently started Nattakinase and Quercetin with bromelaw this week with noted benefit.   He's also received vitamin infusion x2 as well as course of zpack, ivermectin and prednisone after Christmas 04/2021 (through Dr Jimmye Norman in [place]). None of this helped.   Has not seen allergist.  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      Relevant past medical, surgical, family and social history reviewed and updated as indicated. Interim medical history since our last visit reviewed. Allergies and medications reviewed and updated. Outpatient Medications Prior to Visit  Medication Sig Dispense Refill   Acetylcysteine (NAC PO) Take by mouth.     Ascorbic Acid (VITAMIN C PO) Take by mouth.     ASPIRIN 81 PO Take by mouth.     B Complex Vitamins (VITAMIN B COMPLEX PO) Take by mouth.     COENZYME Q10 PO Take by mouth.     Cyanocobalamin (VITAMIN B12 PO) Take by mouth.     MAGNESIUM PO Take by mouth.     MELATONIN PO Take by mouth.     MISC NATURAL PRODUCTS EX Apply topically. Lifewave X39 patches     MISC NATURAL PRODUCTS PO Take by mouth. Nattakinase     MISC NATURAL PRODUCTS PO Take by mouth. Histamine digest     MISC NATURAL PRODUCTS PO Take by mouth. Flora Udo's oil 3-6-9     omeprazole (PRILOSEC) 40 MG capsule Take 1 capsule (40 mg  total) by mouth daily. For 3 weeks then as needed 30 capsule 3   QUERCETIN PO Take by mouth. With bromelain     Vitamin D-Vitamin K (VITAMIN K2-VITAMIN D3 PO) Take by mouth.     MISC NATURAL PRODUCTS PO Take by mouth. H1 and H2 blockers     LORazepam (ATIVAN) 0.5 MG tablet Take 1 tablet (0.5 mg total) by mouth 2 (two) times daily as needed for anxiety (sedation precautions). 30 tablet 0   venlafaxine XR (EFFEXOR XR) 37.5 MG 24 hr capsule Take 1 capsule (37.5 mg total) by mouth daily with breakfast. 30 capsule 3   No facility-administered medications prior to visit.     Per HPI unless specifically indicated in ROS section below Review of  Systems  Objective:  BP 124/78    Pulse 77    Temp 97.7 F (36.5 C) (Temporal)    Ht 5\' 8"  (1.727 m)    Wt 208 lb (94.3 kg)    SpO2 98%    BMI 31.63 kg/m   Wt Readings from Last 3 Encounters:  07/03/21 208 lb (94.3 kg)  12/09/19 200 lb (90.7 kg)  11/23/19 205 lb 5 oz (93.1 kg)      Physical Exam Vitals and nursing note reviewed.  Constitutional:      Appearance: Normal appearance. He is not ill-appearing.  HENT:     Head: Normocephalic and atraumatic.  Eyes:     Extraocular Movements: Extraocular movements intact.     Conjunctiva/sclera: Conjunctivae normal.     Pupils: Pupils are equal, round, and reactive to light.  Neck:     Thyroid: No thyroid mass or thyromegaly.     Vascular: No carotid bruit.  Cardiovascular:     Rate and Rhythm: Normal rate and regular rhythm.     Pulses: Normal pulses.     Heart sounds: Normal heart sounds. No murmur heard. Pulmonary:     Effort: Pulmonary effort is normal. No respiratory distress.     Breath sounds: Normal breath sounds. No wheezing, rhonchi or rales.  Musculoskeletal:        General: No swelling. Normal range of motion.     Cervical back: Normal range of motion and neck supple.     Right lower leg: No edema.     Left lower leg: No edema.  Lymphadenopathy:     Cervical: No cervical adenopathy.  Skin:    General: Skin is warm and dry.     Findings: No rash.  Neurological:     Mental Status: He is alert.  Psychiatric:        Mood and Affect: Mood normal.        Behavior: Behavior normal.      Results for orders placed or performed in visit on 07/03/21  Ferritin  Result Value Ref Range   Ferritin 208.2 22.0 - 322.0 ng/mL  IBC panel  Result Value Ref Range   Iron 103 42 - 165 ug/dL   Transferrin 257.0 212.0 - 360.0 mg/dL   Saturation Ratios 28.6 20.0 - 50.0 %   TIBC 359.8 250.0 - 450.0 mcg/dL  Sedimentation rate  Result Value Ref Range   Sed Rate 5 0 - 20 mm/hr  C-reactive protein  Result Value Ref Range   CRP  <1.0 0.5 - 20.0 mg/dL  Lipid panel  Result Value Ref Range   Cholesterol 226 (H) 0 - 200 mg/dL   Triglycerides 79.0 0.0 - 149.0 mg/dL   HDL 42.30 >39.00 mg/dL  VLDL 15.8 0.0 - 40.0 mg/dL   LDL Cholesterol 168 (H) 0 - 99 mg/dL   Total CHOL/HDL Ratio 5    NonHDL 183.59   Comprehensive metabolic panel  Result Value Ref Range   Sodium 141 135 - 145 mEq/L   Potassium 4.7 3.5 - 5.1 mEq/L   Chloride 104 96 - 112 mEq/L   CO2 31 19 - 32 mEq/L   Glucose, Bld 110 (H) 70 - 99 mg/dL   BUN 16 6 - 23 mg/dL   Creatinine, Ser 1.15 0.40 - 1.50 mg/dL   Total Bilirubin 2.5 (H) 0.2 - 1.2 mg/dL   Alkaline Phosphatase 60 39 - 117 U/L   AST 20 0 - 37 U/L   ALT 26 0 - 53 U/L   Total Protein 6.9 6.0 - 8.3 g/dL   Albumin 4.7 3.5 - 5.2 g/dL   GFR 67.86 >60.00 mL/min   Calcium 9.5 8.4 - 10.5 mg/dL  TSH  Result Value Ref Range   TSH 1.06 0.35 - 5.50 uIU/mL  T3  Result Value Ref Range   T3, Total 103 76 - 181 ng/dL  T4, free  Result Value Ref Range   Free T4 0.97 0.60 - 1.60 ng/dL  CBC with Differential/Platelet  Result Value Ref Range   WBC 5.0 4.0 - 10.5 K/uL   RBC 5.24 4.22 - 5.81 Mil/uL   Hemoglobin 16.1 13.0 - 17.0 g/dL   HCT 47.1 39.0 - 52.0 %   MCV 89.9 78.0 - 100.0 fl   MCHC 34.1 30.0 - 36.0 g/dL   RDW 13.2 11.5 - 15.5 %   Platelets 228.0 150.0 - 400.0 K/uL   Neutrophils Relative % 52.3 43.0 - 77.0 %   Lymphocytes Relative 35.5 12.0 - 46.0 %   Monocytes Relative 9.0 3.0 - 12.0 %   Eosinophils Relative 2.5 0.0 - 5.0 %   Basophils Relative 0.7 0.0 - 3.0 %   Neutro Abs 2.6 1.4 - 7.7 K/uL   Lymphs Abs 1.8 0.7 - 4.0 K/uL   Monocytes Absolute 0.4 0.1 - 1.0 K/uL   Eosinophils Absolute 0.1 0.0 - 0.7 K/uL   Basophils Absolute 0.0 0.0 - 0.1 K/uL  D-dimer, quantitative  Result Value Ref Range   D-Dimer, Quant 0.20 <0.50 mcg/mL FEU   EKG - NSR rate 70, normal axis, intervals, no hypertrophy or acute ST/T changes  Assessment & Plan:  This visit occurred during the SARS-CoV-2 public health  emergency.  Safety protocols were in place, including screening questions prior to the visit, additional usage of staff PPE, and extensive cleaning of exam room while observing appropriate contact time as indicated for disinfecting solutions.   Problem List Items Addressed This Visit     HLD (hyperlipidemia)    Update FLP off cholesterol medication. The 10-year ASCVD risk score (Arnett DK, et al., 2019) is: 12.6%   Values used to calculate the score:     Age: 47 years     Sex: Male     Is Non-Hispanic African American: No     Diabetic: No     Tobacco smoker: No     Systolic Blood Pressure: 245 mmHg     Is BP treated: No     HDL Cholesterol: 42.3 mg/dL     Total Cholesterol: 226 mg/dL       Relevant Medications   ASPIRIN 81 PO   Anxiety state    Did not feel well with trial of low dose effexor or lorazepam.  History of prostate cancer    Regularly followed by urology Dr Alinda Money      Shortness of breath    Ongoing post-COVID dyspnea and markedly decreased exercise tolerance.  Reassuring cardiac evaluation 09/2019 - echocardiogram (normal valves, EF 60-65%), coronary calcium score of zero 10/2019.  Will refer to pulmonology/long COVID clinic for further evaluation.       Relevant Orders   Ambulatory referral to Pulmonology   GERD (gastroesophageal reflux disease)    Continue omeprazole       COVID-19 long hauler manifesting chronic fatigue - Primary    Has symptoms consistent with long COVID syndrome.  This is distressing to patient.  Check further labwork today to evaluate for reversible causes.  Will refer to [facility]/pulmonology.  Finds zyrtec and omeprazole have actually helped, as well as recent addition of Nattakinase and Quercetin w/ bromelaw supplements. Continue these.       Relevant Orders   Ferritin (Completed)   IBC panel (Completed)   Sedimentation rate (Completed)   ANA   C-reactive protein (Completed)   Lipid panel (Completed)    Comprehensive metabolic panel (Completed)   TSH (Completed)   T3 (Completed)   T4, free (Completed)   CBC with Differential/Platelet (Completed)   D-dimer, quantitative (Completed)   Ambulatory referral to Pulmonology   Chest discomfort    Recent episodes of sharp substernal chest discomfort, not exertional or relieved by rest, that have since resolved. Check D-dimer today.       Relevant Orders   EKG 12-Lead (Completed)   D-dimer, quantitative (Completed)     Meds ordered this encounter  Medications   cetirizine (ZYRTEC) 10 MG tablet    Sig: Take 1 tablet (10 mg total) by mouth daily.    Dispense:  30 tablet    Refill:  11   Orders Placed This Encounter  Procedures   Ferritin   IBC panel   Sedimentation rate   ANA   C-reactive protein   Lipid panel   Comprehensive metabolic panel   TSH   T3   T4, free   CBC with Differential/Platelet   D-dimer, quantitative   Ambulatory referral to Pulmonology    Referral Priority:   Routine    Referral Type:   Consultation    Referral Reason:   Specialty Services Required    Requested Specialty:   Pulmonary Disease    Number of Visits Requested:   1   EKG 12-Lead    Patient Instructions  Good to see you today Labs today  We will refer you to lung doctor and likely COVID long haul clinic for further evaluation.  May consider PT course.   Sleep hygiene checklist: 1. Avoid naps during the day 2. Avoid stimulants such as caffeine and nicotine. Avoid bedtime alcohol (it can speed onset of sleep but the body's metabolism can cause awakenings). 3. All forms of exercise help ensure sound sleep - limit vigorous exercise to morning or late afternoon 4. Avoid food too close to bedtime including chocolate (which contains caffeine) 5. Soak up natural light 6. Establish regular bedtime routine. 7. Associate bed with sleep - avoid TV, computer or phone, reading while in bed. 8. Ensure pleasant, relaxing sleep environment - quiet, dark,  cool room.   Follow up plan: Return if symptoms worsen or fail to improve.  Ria Bush, MD

## 2021-07-03 NOTE — Patient Instructions (Addendum)
Good to see you today Labs today  We will refer you to lung doctor and likely COVID long haul clinic for further evaluation.  May consider PT course.   Sleep hygiene checklist: 1. Avoid naps during the day 2. Avoid stimulants such as caffeine and nicotine. Avoid bedtime alcohol (it can speed onset of sleep but the body's metabolism can cause awakenings). 3. All forms of exercise help ensure sound sleep - limit vigorous exercise to morning or late afternoon 4. Avoid food too close to bedtime including chocolate (which contains caffeine) 5. Soak up natural light 6. Establish regular bedtime routine. 7. Associate bed with sleep - avoid TV, computer or phone, reading while in bed. 8. Ensure pleasant, relaxing sleep environment - quiet, dark, cool room.

## 2021-07-04 ENCOUNTER — Encounter: Payer: Self-pay | Admitting: Family Medicine

## 2021-07-04 DIAGNOSIS — U099 Post covid-19 condition, unspecified: Secondary | ICD-10-CM | POA: Insufficient documentation

## 2021-07-04 DIAGNOSIS — R0789 Other chest pain: Secondary | ICD-10-CM | POA: Insufficient documentation

## 2021-07-04 LAB — D-DIMER, QUANTITATIVE: D-Dimer, Quant: 0.2 mcg/mL FEU (ref <0.50)

## 2021-07-04 NOTE — Assessment & Plan Note (Signed)
Recent episodes of sharp substernal chest discomfort, not exertional or relieved by rest, that have since resolved. Check D-dimer today.

## 2021-07-04 NOTE — Assessment & Plan Note (Addendum)
Regularly followed by urology Dr Alinda Money

## 2021-07-04 NOTE — Assessment & Plan Note (Signed)
Continue omeprazole 

## 2021-07-04 NOTE — Assessment & Plan Note (Signed)
Did not feel well with trial of low dose effexor or lorazepam.

## 2021-07-04 NOTE — Assessment & Plan Note (Signed)
Update FLP off cholesterol medication. The 10-year ASCVD risk score (Arnett DK, et al., 2019) is: 12.6%   Values used to calculate the score:     Age: 66 years     Sex: Male     Is Non-Hispanic African American: No     Diabetic: No     Tobacco smoker: No     Systolic Blood Pressure: 161 mmHg     Is BP treated: No     HDL Cholesterol: 42.3 mg/dL     Total Cholesterol: 226 mg/dL

## 2021-07-04 NOTE — Assessment & Plan Note (Signed)
Ongoing post-COVID dyspnea and markedly decreased exercise tolerance.  Reassuring cardiac evaluation 09/2019 - echocardiogram (normal valves, EF 60-65%), coronary calcium score of zero 10/2019.  Will refer to pulmonology/long COVID clinic for further evaluation.

## 2021-07-04 NOTE — Assessment & Plan Note (Addendum)
Has symptoms consistent with long COVID syndrome.  This is distressing to patient.  Check further labwork today to evaluate for reversible causes.  Will refer to [facility] clinic/pulmonology.  Finds zyrtec and omeprazole have actually helped, as well as recent addition of Nattakinase and Quercetin w/ bromelaw supplements. Continue these.

## 2021-07-06 LAB — T3: T3, Total: 103 ng/dL (ref 76–181)

## 2021-07-06 LAB — ANA: Anti Nuclear Antibody (ANA): NEGATIVE

## 2021-08-01 ENCOUNTER — Other Ambulatory Visit: Payer: BC Managed Care – PPO

## 2021-08-08 ENCOUNTER — Ambulatory Visit (INDEPENDENT_AMBULATORY_CARE_PROVIDER_SITE_OTHER): Payer: BC Managed Care – PPO | Admitting: Family Medicine

## 2021-08-08 ENCOUNTER — Other Ambulatory Visit: Payer: Self-pay

## 2021-08-08 ENCOUNTER — Encounter: Payer: Self-pay | Admitting: Family Medicine

## 2021-08-08 VITALS — BP 136/78 | HR 75 | Temp 97.6°F | Ht 67.0 in | Wt 210.0 lb

## 2021-08-08 DIAGNOSIS — Z8546 Personal history of malignant neoplasm of prostate: Secondary | ICD-10-CM

## 2021-08-08 DIAGNOSIS — E785 Hyperlipidemia, unspecified: Secondary | ICD-10-CM | POA: Diagnosis not present

## 2021-08-08 DIAGNOSIS — Z0001 Encounter for general adult medical examination with abnormal findings: Secondary | ICD-10-CM

## 2021-08-08 DIAGNOSIS — Z0000 Encounter for general adult medical examination without abnormal findings: Secondary | ICD-10-CM | POA: Insufficient documentation

## 2021-08-08 DIAGNOSIS — R0789 Other chest pain: Secondary | ICD-10-CM

## 2021-08-08 DIAGNOSIS — U099 Post covid-19 condition, unspecified: Secondary | ICD-10-CM

## 2021-08-08 DIAGNOSIS — G9332 Myalgic encephalomyelitis/chronic fatigue syndrome: Secondary | ICD-10-CM

## 2021-08-08 NOTE — Assessment & Plan Note (Addendum)
Off statin. Discussed healthy diet choices to improve LDL cholesterol levels. He is planning to start mediterranean diet. Given low calcium score (2021), will stay off statin.  ?The 10-year ASCVD risk score (Arnett DK, et al., 2019) is: 14.7% ?  Values used to calculate the score: ?    Age: 66 years ?    Sex: Male ?    Is Non-Hispanic African American: No ?    Diabetic: No ?    Tobacco smoker: No ?    Systolic Blood Pressure: 768 mmHg ?    Is BP treated: No ?    HDL Cholesterol: 42.3 mg/dL ?    Total Cholesterol: 226 mg/dL  ?

## 2021-08-08 NOTE — Assessment & Plan Note (Signed)
Preventative protocols reviewed and updated unless pt declined. Discussed healthy diet and lifestyle.  

## 2021-08-08 NOTE — Assessment & Plan Note (Signed)
S/p radical prostatectomy 08/2018, regularly followed by Dr Alinda Money urology.  ?

## 2021-08-08 NOTE — Progress Notes (Signed)
? ? Patient ID: Christopher Contreras, male    DOB: 03/18/1956, 66 y.o.   MRN: [id number] ? ?This visit was conducted in person. ? ?BP 136/78   Pulse 75   Temp 97.6 ?F (36.4 ?C) (Temporal)   Ht '5\' 7"'$  (1.702 m)   Wt 210 lb (95.3 kg)   SpO2 98%   BMI 32.89 kg/m?   ? ?CC: CPE ?Subjective:  ? ?HPI: ?Christopher Contreras is a 66 y.o. male presenting on 08/08/2021 for Annual Exam ? ? ?See prior note for details - upcoming appt at end of month with pulm for further evaluation of long COVID with residual exertional dyspnea. Recent trip to [facility] where he felt exhausted for 1 wk - lay on couch most of the day. Has had several good weeks in the interim.  ? ?Noticing ongoing bilateral dull chest wall ache.  ?He continues Nattakinase, quercetin with bromelaw and NADH supplements with improvement in energy levels - by recommendation of online long-COVID specialist.  ?Notes alcohol exacerbates long COVID symptoms  ? ?Preventative: ?COLONOSCOPY 02/2020 - 1 TA, rpt 5 yrs ([facility]) ?Prostate cancer s/p radical prostatectomy 08/2018 Alinda Money) sees Q 6 months  ?Lung cancer screening - not eligible  ?Flu shot - declines ?[place] 11/2019, 12/2019, booster ~04/2020 ?Td 1999, 2011 ?Pneumonia shot -  ?Shingrix - discussed - he is interested but prefers to postpone for now ?Advanced directive discussion -  ?Seat belt use discussed ?Sunscreen use discussed. No changing moles on skin. Saw derm s/p Mohs 01/2020 to right ear  ?Non smoker  ?Alcohol - none ?Dentist - q6 mo ?Eye exam - yearly ? ?Married; lives with wife. 3 sons ?Retired [employer] ?Occ: Glass blower/designer of [employer] ?Activity: activity limited by easy tachycardia ?Diet: good water, fruits/vegetables daily, planning to try mediterranean diet ?   ? ?Relevant past medical, surgical, family and social history reviewed and updated as indicated. Interim medical history since our last visit reviewed. ?Allergies and medications reviewed and updated. ?Outpatient Medications Prior  to Visit  ?Medication Sig Dispense Refill  ? Acetylcysteine (NAC PO) Take by mouth.    ? Ascorbic Acid (VITAMIN C PO) Take by mouth.    ? ASPIRIN 81 PO Take by mouth.    ? B Complex Vitamins (VITAMIN B COMPLEX PO) Take by mouth.    ? cetirizine (ZYRTEC) 10 MG tablet Take 1 tablet (10 mg total) by mouth daily. 30 tablet 11  ? COENZYME Q10 PO Take by mouth.    ? Cyanocobalamin (VITAMIN B12 PO) Take by mouth.    ? MAGNESIUM PO Take by mouth.    ? MELATONIN PO Take by mouth.    ? MISC NATURAL PRODUCTS EX Apply topically. Lifewave X39 patches    ? MISC NATURAL PRODUCTS PO Take by mouth. Nattakinase    ? MISC NATURAL PRODUCTS PO Take by mouth. Histamine digest    ? MISC NATURAL PRODUCTS PO Take by mouth. Flora Udo's oil 3-6-9    ? MISC NATURAL PRODUCTS PO Take 1 capsule by mouth daily. NADH 60 mg daily    ? omeprazole (PRILOSEC) 40 MG capsule Take 1 capsule (40 mg total) by mouth daily. For 3 weeks then as needed 30 capsule 3  ? QUERCETIN PO Take by mouth. With bromelain    ? Vitamin D-Vitamin K (VITAMIN K2-VITAMIN D3 PO) Take by mouth.    ? ?No facility-administered medications prior to visit.  ?  ? ?Per HPI unless specifically indicated in ROS section  below ?Review of Systems  ?Constitutional:  Positive for appetite change (comes and goes) and fatigue. Negative for activity change, chills, fever and unexpected weight change.  ?HENT:  Negative for hearing loss.   ?Eyes:  Negative for visual disturbance.  ?Respiratory:  Positive for shortness of breath (comes and goes). Negative for cough, chest tightness and wheezing.   ?Cardiovascular:  Positive for chest pain (dull ache). Negative for palpitations and leg swelling.  ?Gastrointestinal:  Negative for abdominal distention, abdominal pain, blood in stool, constipation, diarrhea, nausea and vomiting.  ?Genitourinary:  Negative for difficulty urinating and hematuria.  ?Musculoskeletal:  Negative for arthralgias, myalgias and neck pain.  ?Skin:  Negative for rash.   ?Neurological:  Positive for light-headedness. Negative for dizziness, seizures, syncope and headaches.  ?     Intermittent vertigo in h/o inner ear damage  ?Hematological:  Negative for adenopathy. Does not bruise/bleed easily.  ?Psychiatric/Behavioral:  Negative for dysphoric mood. The patient is not nervous/anxious.   ? ?Objective:  ?BP 136/78   Pulse 75   Temp 97.6 ?F (36.4 ?C) (Temporal)   Ht '5\' 7"'$  (1.702 m)   Wt 210 lb (95.3 kg)   SpO2 98%   BMI 32.89 kg/m?   ?Wt Readings from Last 3 Encounters:  ?08/08/21 210 lb (95.3 kg)  ?07/03/21 208 lb (94.3 kg)  ?12/09/19 200 lb (90.7 kg)  ?  ?  ?Physical Exam ?Vitals and nursing note reviewed.  ?Constitutional:   ?   General: He is not in acute distress. ?   Appearance: Normal appearance. He is well-developed. He is not ill-appearing.  ?HENT:  ?   Head: Normocephalic and atraumatic.  ?   Right Ear: Hearing, tympanic membrane, ear canal and external ear normal.  ?   Left Ear: Hearing, tympanic membrane, ear canal and external ear normal.  ?Eyes:  ?   General: No scleral icterus. ?   Extraocular Movements: Extraocular movements intact.  ?   Conjunctiva/sclera: Conjunctivae normal.  ?   Pupils: Pupils are equal, round, and reactive to light.  ?Neck:  ?   Thyroid: No thyroid mass or thyromegaly.  ?Cardiovascular:  ?   Rate and Rhythm: Normal rate and regular rhythm.  ?   Pulses: Normal pulses.     ?     Radial pulses are 2+ on the right side and 2+ on the left side.  ?   Heart sounds: Normal heart sounds. No murmur heard. ?Pulmonary:  ?   Effort: Pulmonary effort is normal. No respiratory distress.  ?   Breath sounds: Normal breath sounds. No wheezing, rhonchi or rales.  ?Abdominal:  ?   General: Bowel sounds are normal. There is no distension.  ?   Palpations: Abdomen is soft. There is no mass.  ?   Tenderness: There is no abdominal tenderness. There is no guarding or rebound.  ?   Hernia: No hernia is present.  ?Musculoskeletal:     ?   General: Normal range of  motion.  ?   Cervical back: Normal range of motion and neck supple.  ?   Right lower leg: No edema.  ?   Left lower leg: No edema.  ?Lymphadenopathy:  ?   Cervical: No cervical adenopathy.  ?Skin: ?   General: Skin is warm and dry.  ?   Findings: No rash.  ?Neurological:  ?   General: No focal deficit present.  ?   Mental Status: He is alert and oriented to person, place, and time.  ?  Psychiatric:     ?   Mood and Affect: Mood normal.     ?   Behavior: Behavior normal.     ?   Thought Content: Thought content normal.     ?   Judgment: Judgment normal.  ? ?   ?Results for orders placed or performed in visit on 07/03/21  ?Ferritin  ?Result Value Ref Range  ? Ferritin 208.2 22.0 - 322.0 ng/mL  ?IBC panel  ?Result Value Ref Range  ? Iron 103 42 - 165 ug/dL  ? Transferrin 257.0 212.0 - 360.0 mg/dL  ? Saturation Ratios 28.6 20.0 - 50.0 %  ? TIBC 359.8 250.0 - 450.0 mcg/dL  ?Sedimentation rate  ?Result Value Ref Range  ? Sed Rate 5 0 - 20 mm/hr  ?ANA  ?Result Value Ref Range  ? Anti Nuclear Antibody (ANA) NEGATIVE NEGATIVE  ?C-reactive protein  ?Result Value Ref Range  ? CRP <1.0 0.5 - 20.0 mg/dL  ?Lipid panel  ?Result Value Ref Range  ? Cholesterol 226 (H) 0 - 200 mg/dL  ? Triglycerides 79.0 0.0 - 149.0 mg/dL  ? HDL 42.30 >39.00 mg/dL  ? VLDL 15.8 0.0 - 40.0 mg/dL  ? LDL Cholesterol 168 (H) 0 - 99 mg/dL  ? Total CHOL/HDL Ratio 5   ? NonHDL 183.59   ?Comprehensive metabolic panel  ?Result Value Ref Range  ? Sodium 141 135 - 145 mEq/L  ? Potassium 4.7 3.5 - 5.1 mEq/L  ? Chloride 104 96 - 112 mEq/L  ? CO2 31 19 - 32 mEq/L  ? Glucose, Bld 110 (H) 70 - 99 mg/dL  ? BUN 16 6 - 23 mg/dL  ? Creatinine, Ser 1.15 0.40 - 1.50 mg/dL  ? Total Bilirubin 2.5 (H) 0.2 - 1.2 mg/dL  ? Alkaline Phosphatase 60 39 - 117 U/L  ? AST 20 0 - 37 U/L  ? ALT 26 0 - 53 U/L  ? Total Protein 6.9 6.0 - 8.3 g/dL  ? Albumin 4.7 3.5 - 5.2 g/dL  ? GFR 67.86 >60.00 mL/min  ? Calcium 9.5 8.4 - 10.5 mg/dL  ?TSH  ?Result Value Ref Range  ? TSH 1.06 0.35 - 5.50  uIU/mL  ?T3  ?Result Value Ref Range  ? T3, Total 103 76 - 181 ng/dL  ?T4, free  ?Result Value Ref Range  ? Free T4 0.97 0.60 - 1.60 ng/dL  ?CBC with Differential/Platelet  ?Result Value Ref Range  ? WBC 5.0 4.0 - 10.5 K/uL  ? RB

## 2021-08-08 NOTE — Patient Instructions (Addendum)
Consider shingrix and tetanus shot vaccine.  ?We will await evaluation by Dr [name] later this month.  ?I do agree with trying mediterranean diet.  ? ?    Mediterranean Diet ? ?Why follow it? Research shows? ?Those who follow the Mediterranean diet have a reduced risk of heart disease  ?The diet is associated with a reduced incidence of Parkinson's and Alzheimer's diseases ?People following the diet may have longer life expectancies and lower rates of chronic diseases  ?The Dietary Guidelines for Americans recommends the Mediterranean diet as an eating plan to promote health and prevent disease ? ?What Is the Mediterranean Diet?  ?Healthy eating plan based on typical foods and recipes of Mediterranean-style cooking ?The diet is primarily a plant based diet; these foods should make up a majority of meals  ? ?Starches - Plant based foods should make up a majority of meals ?- They are an important sources of vitamins, minerals, energy, antioxidants, and fiber ?- Choose whole grains, foods high in fiber and minimally processed items  ?- Typical grain sources include wheat, oats, barley, corn, brown rice, bulgar, farro, millet, polenta, couscous  ?- Various types of beans include chickpeas, lentils, fava beans, black beans, white beans   ?Fruits  Veggies - Large quantities of antioxidant rich fruits & veggies; 6 or more servings  ?- Vegetables can be eaten raw or lightly drizzled with oil and cooked  ?- Vegetables common to the traditional Mediterranean Diet include: artichokes, arugula, beets, broccoli, brussel sprouts, cabbage, carrots, celery, collard greens, cucumbers, eggplant, kale, leeks, lemons, lettuce, mushrooms, okra, onions, peas, peppers, potatoes, pumpkin, radishes, rutabaga, shallots, spinach, sweet potatoes, turnips, zucchini ?- Fruits common to the Mediterranean Diet include: apples, apricots, avocados, cherries, clementines, dates, figs, grapefruits, grapes, melons, nectarines, oranges, peaches, pears,  pomegranates, strawberries, tangerines  ?Fats - Replace butter and margarine with healthy oils, such as olive oil, canola oil, and tahini  ?- Limit nuts to no more than a handful a day  ?- Nuts include walnuts, almonds, pecans, pistachios, pine nuts  ?- Limit or avoid candied, honey roasted or heavily salted nuts ?- Olives are central to the Mediterranean diet - can be eaten whole or used in a variety of dishes   ?Meats Protein - Limiting red meat: no more than a few times a month ?- When eating red meat: choose lean cuts and keep the portion to the size of deck of cards ?- Eggs: approx. 0 to 4 times a week  ?- Fish and lean poultry: at least 2 a week  ?- Healthy protein sources include, chicken, [place], lean beef, lamb ?- Increase intake of seafood such as tuna, salmon, trout, mackerel, shrimp, scallops ?- Avoid or limit high fat processed meats such as sausage and bacon  ?Dairy - Include moderate amounts of low fat dairy products  ?- Focus on healthy dairy such as fat free yogurt, skim milk, low or reduced fat cheese ?- Limit dairy products higher in fat such as whole or 2% milk, cheese, ice cream  ?Alcohol - Moderate amounts of red wine is ok  ?- No more than 5 oz daily for women (all ages) and men older than age 4  ?- No more than 10 oz of wine daily for men younger than 13  ?Other - Limit sweets and other desserts  ?- Use herbs and spices instead of salt to flavor foods  ?- Herbs and spices common to the traditional Mediterranean Diet include: basil, bay leaves, chives, cloves, cumin, fennel, garlic,  lavender, marjoram, mint, oregano, parsley, pepper, rosemary, sage, savory, sumac, tarragon, thyme  ? ?It?s not just a diet, it?s a lifestyle:  ?The Mediterranean diet includes lifestyle factors typical of those in the region  ?Foods, drinks and meals are best eaten with others and savored ?Daily physical activity is important for overall good health ?This could be strenuous exercise like running and  aerobics ?This could also be more leisurely activities such as walking, housework, yard-work, or taking the stairs ?Moderation is the key; a balanced and healthy diet accommodates most foods and drinks ?Consider portion sizes and frequency of consumption of certain foods  ? ?Meal Ideas & Options:  ?Breakfast:  ?Whole wheat toast or whole wheat English muffins with peanut butter & hard boiled egg ?Steel cut oats topped with apples & cinnamon and skim milk  ?Fresh fruit: banana, strawberries, melon, berries, peaches  ?Smoothies: strawberries, bananas, greek yogurt, peanut butter ?Low fat greek yogurt with blueberries and granola  ?Egg white omelet with spinach and mushrooms ?Breakfast couscous: whole wheat couscous, apricots, skim milk, cranberries  ?Sandwiches:  ?Hummus and grilled vegetables (peppers, zucchini, squash) on whole wheat bread   ?Grilled chicken on whole wheat pita with lettuce, tomatoes, cucumbers or tzatziki  ?Tuna salad on whole wheat bread: tuna salad made with greek yogurt, olives, red peppers, capers, green onions ?Garlic rosemary lamb pita: lamb saut?ed with garlic, rosemary, salt & pepper; add lettuce, cucumber, greek yogurt to pita - flavor with lemon juice and black pepper  ?Seafood:  ?Mediterranean grilled salmon, seasoned with garlic, basil, parsley, lemon juice and black pepper ?Shrimp, lemon, and spinach whole-grain pasta salad made with low fat greek yogurt  ?Seared scallops with lemon orzo  ?Seared tuna steaks seasoned salt, pepper, coriander topped with tomato mixture of olives, tomatoes, olive oil, minced garlic, parsley, green onions and cappers  ?Meats:  ?Herbed greek chicken salad with kalamata olives, cucumber, feta  ?Red bell peppers stuffed with spinach, bulgur, lean ground beef (or lentils) & topped with feta   ?Kebabs: skewers of chicken, tomatoes, onions, zucchini, squash  ?[place] burgers: made with red onions, mint, dill, lemon juice, feta cheese topped with roasted red  peppers ?Vegetarian ?Cucumber salad: cucumbers, artichoke hearts, celery, red onion, feta cheese, tossed in olive oil & lemon juice  ?Hummus and whole grain pita points with a greek salad (lettuce, tomato, feta, olives, cucumbers, red onion) ?Lentil soup with celery, carrots made with vegetable broth, garlic, salt and pepper  ?Tabouli salad: parsley, bulgur, mint, scallions, cucumbers, tomato, radishes, lemon juice, olive oil, salt and pepper.  ?

## 2021-08-24 ENCOUNTER — Ambulatory Visit
Admission: RE | Admit: 2021-08-24 | Discharge: 2021-08-24 | Disposition: A | Discharge status: Home / Self Care | Payer: BC Managed Care – PPO | Attending: Internal Medicine | Admitting: Internal Medicine

## 2021-08-24 ENCOUNTER — Ambulatory Visit (INDEPENDENT_AMBULATORY_CARE_PROVIDER_SITE_OTHER): Payer: BC Managed Care – PPO | Admitting: Internal Medicine

## 2021-08-24 ENCOUNTER — Ambulatory Visit
Admission: RE | Admit: 2021-08-24 | Discharge: 2021-08-24 | Disposition: A | Discharge status: Home / Self Care | Payer: BC Managed Care – PPO | Source: Ambulatory Visit | Attending: Internal Medicine | Admitting: Internal Medicine

## 2021-08-24 ENCOUNTER — Encounter: Payer: Self-pay | Admitting: Internal Medicine

## 2021-08-24 DIAGNOSIS — R0609 Other forms of dyspnea: Secondary | ICD-10-CM

## 2021-08-24 DIAGNOSIS — R0600 Dyspnea, unspecified: Secondary | ICD-10-CM | POA: Diagnosis not present

## 2021-08-24 MED ORDER — PROPRANOLOL HCL 20 MG PO TABS
20.0000 mg | ORAL_TABLET | Freq: Two times a day (BID) | ORAL | 11 refills | Status: DC
Start: 2021-08-24 — End: 2022-06-29

## 2021-08-24 MED ORDER — FAMOTIDINE 20 MG PO TABS: ORAL_TABLET | ORAL | 11 refills | Status: DC

## 2021-08-24 NOTE — Assessment & Plan Note (Addendum)
Onset p Covid 2020 with neg cards w/u March -June 2021 and [facility] on EGD 02/2020  ?- 08/24/2021   Walked on RA  x  3  lap(s) =  approx 525  ft  @ fast pace, stopped due to end of study with lowest 02 sats 96 and pulse 109 highest ?- 08/24/2021 rec inderal 20 mg bid and sub max ex x 6 weeks then consider cpst  ? ?Symptoms are markedly disproportionate to objective findings and not clear to what extent this is actually a pulmonary  problem but pt does appear to have difficult to sort out respiratory symptoms of unknown origin for which  DDX  = almost all start with A and  include Adherence, Ace Inhibitors, Acid Reflux, Active Sinus Disease, Alpha 1 Antitripsin deficiency, Anxiety masquerading as Airways dz,  ABPA,  Allergy(esp in young), Aspiration (esp in elderly), Adverse effects of meds,  Active smoking or Vaping, A bunch of PE's/clot burden (a few small clots can't cause this syndrome unless there is already severe underlying pulm or vascular dz with poor reserve),  Anemia or thyroid disorder, plus two Bs  = Bronchiectasis and Beta blocker use..and one C= CHF  ?  ? ?Adherence is always the initial "prime suspect" and is a multilayered concern that requires a "trust but verify" approach in every patient - starting with knowing how to use medications, especially inhalers, correctly, keeping up with refills and understanding the fundamental difference between maintenance and prns vs those medications only taken for a very short course and then stopped and not refilled.  ? ?? Acid (or non-acid) GERD > always difficult to exclude as up to 75% of pts in some series report no assoc GI/ Heartburn symptoms and he has large HH > rec max (24h)  acid suppression and diet restrictions/ reviewed and instructions given in writing.  ? ?? Anxiety/ palpitations > try inderol 20 mg bid ? ?? Anemia/ thyroid dz > ruled out ? ?? Adverse effects of meds > none of the usual suspects listed  ? ?? A bunch of PEs : D dimer nl - while a normal  vlue  may miss small peripheral pe, the clot burden with sob is moderately high and the d dimer  has a very high neg pred value if used in this setting.   ? ?? CHF > exluded by cards already and low risk based on this w/u  ? ?F/u in 6 weeks p trying sub max ex up to 30 min daily with cpst next step ? ? ?Each maintenance medication was reviewed in detail including emphasizing most importantly the difference between maintenance and prns and under what circumstances the prns are to be triggered using an action plan format where appropriate. ? ?Total time for H and P, chart review, counseling,  directly observing portions of ambulatory 02 saturation study/ and generating customized AVS unique to this office visit / same day charting > 45 min with pt new to me ?     ?  ?       ?

## 2021-08-24 NOTE — Patient Instructions (Addendum)
GERD (REFLUX)  is an extremely common cause of respiratory symptoms just like yours , many times with no obvious heartburn at all.  ? ? It can be treated with medication, but also with lifestyle changes including elevation of the head of your bed (ideally with 6 -8inch blocks under the headboard of your bed),  Smoking cessation, avoidance of late meals, excessive alcohol, and avoid fatty foods, chocolate, peppermint, colas, red wine, and acidic juices such as orange juice.  ?NO MINT OR MENTHOL PRODUCTS SO NO COUGH DROPS  ?USE SUGARLESS CANDY INSTEAD (Jolley ranchers or Stover's or Life Savers) or even ice chips will also do - the key is to swallow to prevent all throat clearing. ?NO OIL BASED VITAMINS - use powdered substitutes.  Avoid fish oil when coughing. ? ?Try Omeprazole '40mg'$   Take 30-60 min before first meal of the day and Pepcid (famotidine) 20 mg one @  bedtime until  return  ? ?To get the most out of exercise, you need to be continuously aware that you are short of breath, but never out of breath, for at least 30 minutes daily. As you improve, it will actually be easier for you to do the same amount of exercise  in  30 minutes so always push to the level where you are short of breath.   ? ?Make sure you check your oxygen saturations at highest level of activity to make sure stays above 90%  ? ?Propranol 20 mg twice daily (will treat the high heart rate )  ? ?Please schedule a follow up office visit in 6 weeks, call sooner if needed - call if want to do the CPST we can schedule in [place]. ? ? ? ? ? ?

## 2021-08-24 NOTE — Progress Notes (Signed)
? ?Christopher Contreras, male    DOB: 04-Mar-1956,    MRN: [id number] ? ? ?Brief patient profile:  ?75  yowm  never smoker office  referred to pulmonary clinic in [place]  08/24/2021 by Dr Christopher Contreras  for doe x Dec 2020 covid. ? ?Baseline able to run 5-8 miles including hills.  ?Fatigue from covid, never back to previous routine due to fast rate / sob (record says cp, pt denies) > ER eval August 10 2019 > cards eval  Coronary CTA with calcium score zero and no evidence of CAD. Echo with normal LV systolic function. His chest pain is not felt to be cardiac > better on gerd rx with [name] on EGD 02/2020 ? ? ? ? ?History of Present Illness  ?08/24/2021  Pulmonary/ 1st office eval/ Christopher Contreras / [name]  Office  ?Chief Complaint  ?Patient presents with  ? pulmonary consult  ?  Hx of covid 2021. C/o sob with exertion and occ at rest.   ?Dyspnea:  varies but can walk indoors same speedas others, no reg ex /chest tightness resolved ? p gerd rx started  ?Cough: rare ?Sleep: flat bed / 2 pillows  ?SABA use:  ?Ppi x 2 years taken pc ? ?No obvious day to day or daytime variability or assoc excess/ purulent sputum or mucus plugs or hemoptysis or cp or chest tightness, subjective wheeze or overt sinus or hb symptoms.  ? ?Sleeping  without nocturnal  or early am exacerbation  of respiratory  c/o's or need for noct saba. Also denies any obvious fluctuation of symptoms with weather or environmental changes or other aggravating or alleviating factors except as outlined above  ? ?No unusual exposure hx or h/o childhood pna/ asthma or knowledge of premature birth. ? ?Current Allergies, Complete Past Medical History, Past Surgical History, Family History, and Social History were reviewed in [employer] record. ? ?ROS  The following are not active complaints unless bolded ?Hoarseness, sore throat, dysphagia, dental problems, itching, sneezing,  nasal congestion or discharge of excess mucus or purulent secretions, ear ache,    fever, chills, sweats, unintended wt loss or wt gain, classically pleuritic or exertional cp,  orthopnea pnd or arm/hand swelling  or leg swelling, presyncope, palpitations, abdominal pain, anorexia, nausea, vomiting, diarrhea  or change in bowel habits or change in bladder habits, change in stools or change in urine, dysuria, hematuria,  rash, arthralgias, visual complaints, headache, numbness, weakness or ataxia or problems with walking or coordination,  change in mood or  memory. ?      ?   ? ?Past Medical History:  ?Diagnosis Date  ? Abdominal discomfort, epigastric 07/20/2016  ? Anxiety   ? ANXIETY 12/27/2008  ? Qualifier: Diagnosis of  By: Christopher Mechanic MD, Hilaria Ota   ? Basal cell carcinoma 10/07/2019  ? Right sup. helix. Superficial and nodular.   ? Basal cell carcinoma of face   ? R neck and back txted in past  ? BPH (benign prostatic hyperplasia) 11/12/2016  ? S/p benign prostate biopsy by Dr Christopher Contreras (10/2016)  ? Dehydration 08/16/2019  ? Disorders of bilirubin excretion   ? Diverticulosis of colon 11/02  ? DIVERTICULOSIS, COLON 03/17/2008  ? Qualifier: Diagnosis of  By: Christopher Mechanic MD, Hilaria Ota   ? Erectile dysfunction 02/13/2017  ? Family history of adverse reaction to anesthesia   ? mother and father diffucult to arouse   ? Fatigue 07/20/2016  ? 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 Heartburn 08/16/2019  ? Hepatitis A  was in 9th grade  ? HLD (hyperlipidemia) 2/08  ? Personal history of other infectious and parasitic disease   ? reports this as a hepatitis A infection in junior HS , denies any liver disorder   ? Prostate cancer ([place])   ? Pseudocholinesterase deficiency   ? patient reports this runs in his family ; but is unsure if he has the trait; see anethesia history for family history   ? Shortness of breath 08/16/2019  ? Vestibular disorder, left   ? ear ; injury occurred in the [place]   ? Vestibular dysfunction of left ear 08/16/2019  ? 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 ? ?Outpatient Medications Prior to Visit   ?Medication Sig Dispense Refill  ? Acetylcysteine (NAC PO) Take by mouth.    ? Ascorbic Acid (VITAMIN C PO) Take by mouth.    ? ASPIRIN 81 PO Take by mouth.    ? B Complex Vitamins (VITAMIN B COMPLEX PO) Take by mouth.    ? cetirizine (ZYRTEC) 10 MG tablet Take 1 tablet (10 mg total) by mouth daily. 30 tablet 11  ? COENZYME Q10 PO Take by mouth.    ? Cyanocobalamin (VITAMIN B12 PO) Take by mouth.    ? MAGNESIUM PO Take by mouth.    ? MELATONIN PO Take by mouth.    ? MISC NATURAL PRODUCTS EX Apply topically. Lifewave X39 patches    ? MISC NATURAL PRODUCTS PO Take by mouth. Nattakinase    ? MISC NATURAL PRODUCTS PO Take by mouth. Histamine digest    ? MISC NATURAL PRODUCTS PO Take by mouth. Flora Udo's oil 3-6-9    ? MISC NATURAL PRODUCTS PO Take 1 capsule by mouth daily. NADH 60 mg daily    ? omeprazole (PRILOSEC) 40 MG capsule Take 1 capsule (40 mg total) by mouth daily. For 3 weeks then as needed 30 capsule 3  ? QUERCETIN PO Take by mouth. With bromelain    ? Vitamin D-Vitamin K (VITAMIN K2-VITAMIN D3 PO) Take by mouth.    ? ?No facility-administered medications prior to visit.  ? ? ? ?Objective:  ?  ? ?BP 132/78 (BP Location: Left Arm, Cuff Size: Normal)   Pulse 79   Temp 97.7 ?F (36.5 ?C) (Temporal)   Ht '5\' 7"'$  (1.702 m)   Wt 210 lb 6.4 oz (95.4 kg)   SpO2 98%   BMI 32.95 kg/m?  ? ?SpO2: 98 % ? ?Pleasant muscular wm, somewhat tense focused on heart rate ? ? HEENT : pt wearing mask not removed for exam due to covid -19 concerns.  ? ? ?NECK :  without JVD/Nodes/TM/ nl carotid upstrokes bilaterally ? ? ?LUNGS: no acc muscle use,  Nl contour chest which is clear to A and P bilaterally without cough on insp or exp maneuvers ? ? ?CV:  RRR  no s3 or murmur or increase in P2, and no edema  ? ?ABD:  soft and nontender with nl inspiratory excursion in the supine position. No bruits or organomegaly appreciated, bowel sounds nl ? ?MS:  Nl gait/ ext warm without deformities, calf tenderness, cyanosis or clubbing ?No  obvious joint restrictions  ? ?SKIN: warm and dry without lesions   ? ?NEURO:  alert, approp, nl sensorium with  no motor or cerebellar deficits apparent.  ? ? ?CXR PA and Lateral:   08/24/2021 :    ?I personally reviewed images and impression is as follows:     ?No acute findings  ? ?Labs ordered/  reviewed:  ? ? ?  Chemistry   ?   ?Component Value Date/Time  ? NA 141 07/03/2021 0849  ? NA 139 10/19/2019 0930  ? K 4.7 07/03/2021 0849  ? CL 104 07/03/2021 0849  ? CO2 31 07/03/2021 0849  ? BUN 16 07/03/2021 0849  ? BUN 12 10/19/2019 0930  ? CREATININE 1.15 07/03/2021 0849  ? CREATININE 1.27 07/20/2016 1546  ?    ?Component Value Date/Time  ? CALCIUM 9.5 07/03/2021 0849  ? ALKPHOS 60 07/03/2021 0849  ? AST 20 07/03/2021 0849  ? ALT 26 07/03/2021 0849  ? BILITOT 2.5 (H) 07/03/2021 0849  ?  ? ?  ? ?Lab Results  ?Component Value Date  ? WBC 5.0 07/03/2021  ? HGB 16.1 07/03/2021  ? HCT 47.1 07/03/2021  ? MCV 89.9 07/03/2021  ? PLT 228.0 07/03/2021  ?  ? ?Lab Results  ?Component Value Date  ? DDIMER 0.20 07/03/2021  ?  ?  ?Lab Results  ?Component Value Date  ? TSH 1.06 07/03/2021  ?  ? ?  ? ?  ?Lab Results  ?Component Value Date  ? ESRSEDRATE 5 07/03/2021  ?  ?   ? ? ? ?   ?Assessment  ? ?DOE (dyspnea on exertion) ?Onset p Covid 2020 with neg cards w/u March -June 2021 and [facility] on EGD 02/2020  ?- 08/24/2021   Walked on RA  x  3  lap(s) =  approx 525  ft  @ fast pace, stopped due to end of study with lowest 02 sats 96 and pulse 109 highest ?- 08/24/2021 rec inderal 20 mg bid and sub max ex x 6 weeks then consider cpst  ? ?Symptoms are markedly disproportionate to objective findings and not clear to what extent this is actually a pulmonary  problem but pt does appear to have difficult to sort out respiratory symptoms of unknown origin for which  DDX  = almost all start with A and  include Adherence, Ace Inhibitors, Acid Reflux, Active Sinus Disease, Alpha 1 Antitripsin deficiency, Anxiety masquerading as Airways dz,  ABPA,   Allergy(esp in young), Aspiration (esp in elderly), Adverse effects of meds,  Active smoking or Vaping, A bunch of PE's/clot burden (a few small clots can't cause this syndrome unless there is already severe underlyin

## 2021-10-11 DIAGNOSIS — C61 Malignant neoplasm of prostate: Secondary | ICD-10-CM | POA: Diagnosis not present

## 2021-10-18 DIAGNOSIS — C61 Malignant neoplasm of prostate: Secondary | ICD-10-CM | POA: Diagnosis not present

## 2021-10-18 DIAGNOSIS — N5201 Erectile dysfunction due to arterial insufficiency: Secondary | ICD-10-CM | POA: Diagnosis not present

## 2021-10-19 ENCOUNTER — Ambulatory Visit: Payer: BC Managed Care – PPO | Admitting: Internal Medicine

## 2021-12-28 DIAGNOSIS — H43393 Other vitreous opacities, bilateral: Secondary | ICD-10-CM | POA: Diagnosis not present

## 2021-12-28 DIAGNOSIS — H469 Unspecified optic neuritis: Secondary | ICD-10-CM | POA: Diagnosis not present

## 2021-12-28 DIAGNOSIS — H35373 Puckering of macula, bilateral: Secondary | ICD-10-CM | POA: Diagnosis not present

## 2021-12-28 DIAGNOSIS — H43813 Vitreous degeneration, bilateral: Secondary | ICD-10-CM | POA: Diagnosis not present

## 2021-12-28 DIAGNOSIS — H53453 Other localized visual field defect, bilateral: Secondary | ICD-10-CM | POA: Diagnosis not present

## 2022-01-13 DIAGNOSIS — R42 Dizziness and giddiness: Secondary | ICD-10-CM | POA: Diagnosis not present

## 2022-01-16 DIAGNOSIS — R42 Dizziness and giddiness: Secondary | ICD-10-CM | POA: Diagnosis not present

## 2022-01-16 DIAGNOSIS — H53453 Other localized visual field defect, bilateral: Secondary | ICD-10-CM | POA: Diagnosis not present

## 2022-01-25 DIAGNOSIS — H35373 Puckering of macula, bilateral: Secondary | ICD-10-CM | POA: Diagnosis not present

## 2022-01-25 DIAGNOSIS — H43813 Vitreous degeneration, bilateral: Secondary | ICD-10-CM | POA: Diagnosis not present

## 2022-01-25 DIAGNOSIS — H43393 Other vitreous opacities, bilateral: Secondary | ICD-10-CM | POA: Diagnosis not present

## 2022-01-25 DIAGNOSIS — H53453 Other localized visual field defect, bilateral: Secondary | ICD-10-CM | POA: Diagnosis not present

## 2022-01-26 ENCOUNTER — Encounter: Payer: Self-pay | Admitting: Family Medicine

## 2022-01-26 ENCOUNTER — Ambulatory Visit (INDEPENDENT_AMBULATORY_CARE_PROVIDER_SITE_OTHER): Payer: BC Managed Care – PPO | Admitting: Family Medicine

## 2022-01-26 VITALS — BP 130/82 | HR 85 | Temp 97.0°F | Wt 205.4 lb

## 2022-01-26 DIAGNOSIS — H8110 Benign paroxysmal vertigo, unspecified ear: Secondary | ICD-10-CM

## 2022-01-26 DIAGNOSIS — G43109 Migraine with aura, not intractable, without status migrainosus: Secondary | ICD-10-CM | POA: Diagnosis not present

## 2022-01-26 DIAGNOSIS — Z23 Encounter for immunization: Secondary | ICD-10-CM

## 2022-01-26 DIAGNOSIS — R400 Somnolence: Secondary | ICD-10-CM | POA: Diagnosis not present

## 2022-01-26 MED ORDER — MECLIZINE HCL 12.5 MG PO TABS: 12.5000 mg | ORAL_TABLET | Freq: Three times a day (TID) | ORAL | 1 refills | Status: DC | PRN

## 2022-01-26 NOTE — Assessment & Plan Note (Signed)
Epworth elevated. Wife noting snoring. He follows with pulmonology - he will reach out to their office and update if he needs a new referral.

## 2022-01-26 NOTE — Assessment & Plan Note (Signed)
Right eye bright light and sudden blindness in 12/2021 - normal MRI. Following with opthalmology

## 2022-01-26 NOTE — Progress Notes (Signed)
Subjective:     Christopher Contreras is a 66 y.o. male presenting for Dizziness (Couple of months, comes and goes./ Aug. 19 spoke with [facility] they prescribed meclizine '25mg'$  daily. Patient states that helped a lot) and Tachycardia (Ongoing for a couple months. Once heartrate is accelerated it doesn't come down. )     HPI  #Dizziness - on and off for 40 years - would flare up occasionally - for the last few months would not go away - 2 weeks ago did a [facility] visit on Saturday and started meclizine and improved within 1 hour - took for 5 days and improved - as soon as he stopped the symptoms returned - taking 12.5 mg bid - with improvement in symptoms - feels like the world is spinning  - triggers - bending and standing up - will typically resolve with relaxing   #tachycardia - x 2.5 years - working with Dr. Darnell Level - worse with activity - will go to 130-140  - saw Dr. [name]  - has propranolol 20 mg for prn use  Wife notes he stops breathing Feeling tired during the day Wife says he snores   Review of Systems   Social History   Tobacco Use  Smoking Status Never  Smokeless Tobacco Never        Objective:    BP Readings from Last 3 Encounters:  01/26/22 130/82  08/24/21 132/78  08/08/21 136/78   Wt Readings from Last 3 Encounters:  01/26/22 205 lb 6.4 oz (93.2 kg)  08/24/21 210 lb 6.4 oz (95.4 kg)  08/08/21 210 lb (95.3 kg)    BP 130/82   Pulse 85   Temp (!) 97 F (36.1 C) (Temporal)   Wt 205 lb 6.4 oz (93.2 kg)   SpO2 98%   BMI 32.17 kg/m    Physical Exam Constitutional:      General: He is not in acute distress.    Appearance: He is well-developed. He is not diaphoretic.  HENT:     Head: Normocephalic and atraumatic.     Right Ear: Tympanic membrane and ear canal normal.     Left Ear: Tympanic membrane and ear canal normal.     Nose: Nose normal.     Mouth/Throat:     Pharynx: Uvula midline.  Eyes:     General: No scleral icterus.     Conjunctiva/sclera: Conjunctivae normal.     Pupils: Pupils are equal, round, and reactive to light.  Cardiovascular:     Rate and Rhythm: Normal rate and regular rhythm.     Heart sounds: Normal heart sounds. No murmur heard. Pulmonary:     Effort: Pulmonary effort is normal. No respiratory distress.     Breath sounds: Normal breath sounds. No wheezing.  Abdominal:     General: Bowel sounds are normal. There is no distension.     Palpations: Abdomen is soft. There is no mass.     Tenderness: There is no abdominal tenderness. There is no guarding.  Musculoskeletal:        General: Normal range of motion.     Cervical back: Normal range of motion and neck supple.  Lymphadenopathy:     Cervical: No cervical adenopathy.  Skin:    General: Skin is warm and dry.     Capillary Refill: Capillary refill takes less than 2 seconds.  Neurological:     Mental Status: He is alert and oriented to person, place, and time.  Assessment & Plan:   Problem List Items Addressed This Visit       Cardiovascular and Mediastinum   Ocular migraine    Right eye bright light and sudden blindness in 12/2021 - normal MRI. Following with opthalmology         Nervous and Auditory   Benign paroxysmal positional vertigo - Primary    Long standing positional now more frequent. Refill meclizine. Handout for home exercises. Update if persisting and recommend vestibular PT. Does have tachycardia but symptoms more consistent with vertigo.       Relevant Medications   meclizine (ANTIVERT) 12.5 MG tablet     Other   Daytime sleepiness    Epworth elevated. Wife noting snoring. He follows with pulmonology - he will reach out to their office and update if he needs a new referral.        Return if symptoms worsen or fail to improve.  Lesleigh Noe, MD

## 2022-01-26 NOTE — Assessment & Plan Note (Signed)
Long standing positional now more frequent. Refill meclizine. Handout for home exercises. Update if persisting and recommend vestibular PT. Does have tachycardia but symptoms more consistent with vertigo.

## 2022-01-26 NOTE — Patient Instructions (Addendum)
Brandt-Daroff exercises Brandt-Daroff exercises use gravity to help dislodge crystals from the semicircular canal. Follow these steps to try Brandt-Daroff exercises: Sit in the middle of a bed with your feet on the floor. Turn your head 45 degrees to the right. Without moving your head, lie down on your left side. Wait for the dizziness to pass, then wait 30 more seconds. If you're not dizzy, pause for 30 seconds. Return to the starting position. Pause for 30 seconds. Turn your head 45 degrees to the left. Repeat steps two and three on the right side. Return to the starting position. Pause for 30 seconds. Complete one set of five repetitions on each side.  Before standing up, wait for any dizziness to pass.  Refill meclizine take as need Try exercises above Update if still getting daily or regular and can try physical therapy  Sleep apnea concern - call Dr. [name] - if referral needed let me know

## 2022-01-30 DIAGNOSIS — Z23 Encounter for immunization: Secondary | ICD-10-CM | POA: Diagnosis not present

## 2022-01-30 NOTE — Addendum Note (Signed)
Addended by: Francella Solian on: 01/30/2022 03:28 PM   Modules accepted: Orders

## 2022-02-06 ENCOUNTER — Ambulatory Visit: Payer: BC Managed Care – PPO | Admitting: Family Medicine

## 2022-02-26 DIAGNOSIS — H8102 Meniere's disease, left ear: Secondary | ICD-10-CM | POA: Diagnosis not present

## 2022-02-26 DIAGNOSIS — H903 Sensorineural hearing loss, bilateral: Secondary | ICD-10-CM | POA: Diagnosis not present

## 2022-02-26 DIAGNOSIS — R42 Dizziness and giddiness: Secondary | ICD-10-CM | POA: Diagnosis not present

## 2022-02-26 DIAGNOSIS — H90A22 Sensorineural hearing loss, unilateral, left ear, with restricted hearing on the contralateral side: Secondary | ICD-10-CM | POA: Diagnosis not present

## 2022-03-12 DIAGNOSIS — H90A22 Sensorineural hearing loss, unilateral, left ear, with restricted hearing on the contralateral side: Secondary | ICD-10-CM | POA: Diagnosis not present

## 2022-03-12 DIAGNOSIS — H8102 Meniere's disease, left ear: Secondary | ICD-10-CM | POA: Diagnosis not present

## 2022-03-14 ENCOUNTER — Telehealth: Payer: Self-pay | Admitting: Family Medicine

## 2022-03-14 DIAGNOSIS — H8102 Meniere's disease, left ear: Secondary | ICD-10-CM

## 2022-03-14 NOTE — Telephone Encounter (Signed)
Lvm asking Sandy to call back.  Need to inform her we have not received a medical clearance from Dr. Darien Ramus office.  Plz fax to [place] at [phone].

## 2022-03-14 NOTE — Telephone Encounter (Signed)
Sandy with [facility] office wanted to know if the fax has came in for medical clearance HCT. Call back [phone] Ext [phone]

## 2022-03-14 NOTE — Telephone Encounter (Signed)
I'm not showing we have received a fax from Dr. Jeannie Fend Vaught's office.  Attempted to rtn Sandy's call.  Message says office is closed for lunch and will reopen at 1:00.  I'll try calling her back later to relay info above.

## 2022-03-15 NOTE — Telephone Encounter (Signed)
Received form requesting medical clearance to prescribed HCTZ for pt.  Placed form in Dr. Synthia Innocent box.

## 2022-03-16 DIAGNOSIS — H8102 Meniere's disease, left ear: Secondary | ICD-10-CM | POA: Insufficient documentation

## 2022-03-16 NOTE — Telephone Encounter (Signed)
Faxed form to [facility] at [phone], ATTN:  Neila Gear, CMA.

## 2022-03-16 NOTE — Telephone Encounter (Signed)
Approval given, form placed in my out box.   BP Readings from Last 3 Encounters:  01/26/22 130/82  08/24/21 132/78  08/08/21 136/78

## 2022-04-03 DIAGNOSIS — R42 Dizziness and giddiness: Secondary | ICD-10-CM | POA: Diagnosis not present

## 2022-04-18 ENCOUNTER — Ambulatory Visit: Payer: BC Managed Care – PPO | Admitting: Dermatology

## 2022-04-23 DIAGNOSIS — R42 Dizziness and giddiness: Secondary | ICD-10-CM | POA: Diagnosis not present

## 2022-04-25 ENCOUNTER — Ambulatory Visit (INDEPENDENT_AMBULATORY_CARE_PROVIDER_SITE_OTHER): Payer: BC Managed Care – PPO | Admitting: Dermatology

## 2022-04-25 VITALS — BP 162/96 | HR 70

## 2022-04-25 DIAGNOSIS — L814 Other melanin hyperpigmentation: Secondary | ICD-10-CM

## 2022-04-25 DIAGNOSIS — Z1283 Encounter for screening for malignant neoplasm of skin: Secondary | ICD-10-CM

## 2022-04-25 DIAGNOSIS — L821 Other seborrheic keratosis: Secondary | ICD-10-CM

## 2022-04-25 DIAGNOSIS — L570 Actinic keratosis: Secondary | ICD-10-CM | POA: Diagnosis not present

## 2022-04-25 DIAGNOSIS — Z808 Family history of malignant neoplasm of other organs or systems: Secondary | ICD-10-CM

## 2022-04-25 DIAGNOSIS — Z85828 Personal history of other malignant neoplasm of skin: Secondary | ICD-10-CM

## 2022-04-25 DIAGNOSIS — L820 Inflamed seborrheic keratosis: Secondary | ICD-10-CM | POA: Diagnosis not present

## 2022-04-25 DIAGNOSIS — L578 Other skin changes due to chronic exposure to nonionizing radiation: Secondary | ICD-10-CM

## 2022-04-25 DIAGNOSIS — D229 Melanocytic nevi, unspecified: Secondary | ICD-10-CM

## 2022-04-25 DIAGNOSIS — D225 Melanocytic nevi of trunk: Secondary | ICD-10-CM

## 2022-04-25 NOTE — Patient Instructions (Addendum)
Cryotherapy Aftercare  Wash gently with soap and water everyday.   Apply Vaseline and Band-Aid daily until healed.     Due to recent changes in healthcare laws, you may see results of your pathology and/or laboratory studies on MyChart before the doctors have had a chance to review them. We understand that in some cases there may be results that are confusing or concerning to you. Please understand that not all results are received at the same time and often the doctors may need to interpret multiple results in order to provide you with the best plan of care or course of treatment. Therefore, we ask that you please give us 2 business days to thoroughly review all your results before contacting the office for clarification. Should we see a critical lab result, you will be contacted sooner.   If You Need Anything After Your Visit  If you have any questions or concerns for your doctor, please call our main line at [phone] and press option 4 to reach your doctor's medical assistant. If no one answers, please leave a voicemail as directed and we will return your call as soon as possible. Messages left after 4 pm will be answered the following business day.   You may also send us a message via MyChart. We typically respond to MyChart messages within 1-2 business days.  For prescription refills, please ask your pharmacy to contact our office. Our fax number is [phone].  If you have an urgent issue when the clinic is closed that cannot wait until the next business day, you can page your doctor at the number below.    Please note that while we do our best to be available for urgent issues outside of office hours, we are not available 24/7.   If you have an urgent issue and are unable to reach us, you may choose to seek medical care at your doctor's office, retail clinic, urgent care center, or emergency room.  If you have a medical emergency, please immediately call 911 or go to the  emergency department.  Pager Numbers  - Dr. Kowalski: [phone]  - Dr. Moye: [phone]  - Dr. Stewart: [phone]  In the event of inclement weather, please call our main line at [phone] for an update on the status of any delays or closures.  Dermatology Medication Tips: Please keep the boxes that topical medications come in in order to help keep track of the instructions about where and how to use these. Pharmacies typically print the medication instructions only on the boxes and not directly on the medication tubes.   If your medication is too expensive, please contact our office at [phone] option 4 or send us a message through MyChart.   We are unable to tell what your co-pay for medications will be in advance as this is different depending on your insurance coverage. However, we may be able to find a substitute medication at lower cost or fill out paperwork to get insurance to cover a needed medication.   If a prior authorization is required to get your medication covered by your insurance company, please allow us 1-2 business days to complete this process.  Drug prices often vary depending on where the prescription is filled and some pharmacies may offer cheaper prices.  The website www.goodrx.com contains coupons for medications through different pharmacies. The prices here do not account for what the cost may be with help from insurance (it may be cheaper with your insurance), but the website can   give you the price if you did not use any insurance.  - You can print the associated coupon and take it with your prescription to the pharmacy.  - You may also stop by our office during regular business hours and pick up a GoodRx coupon card.  - If you need your prescription sent electronically to a different pharmacy, notify our office through [place] MyChart or by phone at [phone] option 4.     Si Usted Necesita Algo Despus de Su Visita  Tambin puede  enviarnos un mensaje a travs de MyChart. Por lo general respondemos a los mensajes de MyChart en el transcurso de 1 a 2 das hbiles.  Para renovar recetas, por favor pida a su farmacia que se ponga en contacto con nuestra oficina. Nuestro nmero de fax es el [phone].  Si tiene un asunto urgente cuando la clnica est cerrada y que no puede esperar hasta el siguiente da hbil, puede llamar/localizar a su doctor(a) al nmero que aparece a continuacin.   Por favor, tenga en cuenta que aunque hacemos todo lo posible para estar disponibles para asuntos urgentes fuera del horario de oficina, no estamos disponibles las 24 horas del da, los 7 das de la semana.   Si tiene un problema urgente y no puede comunicarse con nosotros, puede optar por buscar atencin mdica  en el consultorio de su doctor(a), en una clnica privada, en un centro de atencin urgente o en una sala de emergencias.  Si tiene una emergencia mdica, por favor llame inmediatamente al 911 o vaya a la sala de emergencias.  Nmeros de bper  - Dr. Kowalski: [phone]  - Dra. Moye: [phone]  - Dra. Stewart: [phone]  En caso de inclemencias del tiempo, por favor llame a nuestra lnea principal al [phone] para una actualizacin sobre el estado de cualquier retraso o cierre.  Consejos para la medicacin en dermatologa: Por favor, guarde las cajas en las que vienen los medicamentos de uso tpico para ayudarle a seguir las instrucciones sobre dnde y cmo usarlos. Las farmacias generalmente imprimen las instrucciones del medicamento slo en las cajas y no directamente en los tubos del medicamento.   Si su medicamento es muy caro, por favor, pngase en contacto con nuestra oficina llamando al [phone] y presione la opcin 4 o envenos un mensaje a travs de MyChart.   No podemos decirle cul ser su copago por los medicamentos por adelantado ya que esto es diferente dependiendo de la cobertura de su seguro.  Sin embargo, es posible que podamos encontrar un medicamento sustituto a menor costo o llenar un formulario para que el seguro cubra el medicamento que se considera necesario.   Si se requiere una autorizacin previa para que su compaa de seguros cubra su medicamento, por favor permtanos de 1 a 2 das hbiles para completar este proceso.  Los precios de los medicamentos varan con frecuencia dependiendo del lugar de dnde se surte la receta y alguna farmacias pueden ofrecer precios ms baratos.  El sitio web www.goodrx.com tiene cupones para medicamentos de diferentes farmacias. Los precios aqu no tienen en cuenta lo que podra costar con la ayuda del seguro (puede ser ms barato con su seguro), pero el sitio web puede darle el precio si no utiliz ningn seguro.  - Puede imprimir el cupn correspondiente y llevarlo con su receta a la farmacia.  - Tambin puede pasar por nuestra oficina durante el horario de atencin regular y recoger una tarjeta de cupones de GoodRx.  -   Si necesita que su receta se enve electrnicamente a una farmacia diferente, informe a nuestra oficina a travs de MyChart de [place] o por telfono llamando al [phone] y presione la opcin 4.  

## 2022-04-25 NOTE — Progress Notes (Signed)
Follow-Up Visit   Subjective  Christopher Contreras is a 66 y.o. male who presents for the following: Total body skin exam (Hx of BCC, Aks) and Skin Tag (L neck, just noticed, irritating). The patient presents for Total-Body Skin Exam (TBSE) for skin cancer screening and mole check.  The patient has spots, moles and lesions to be evaluated, some may be new or changing and the patient has concerns that these could be cancer.   The following portions of the chart were reviewed this encounter and updated as appropriate:   Tobacco  Allergies  Meds  Problems  Med Hx  Surg Hx  Fam Hx     Review of Systems:  No other skin or systemic complaints except as noted in HPI or Assessment and Plan.  Objective  Well appearing patient in no apparent distress; mood and affect are within normal limits.  A full examination was performed including scalp, head, eyes, ears, nose, lips, neck, chest, axillae, abdomen, back, buttocks, bilateral upper extremities, bilateral lower extremities, hands, feet, fingers, toes, fingernails, and toenails. All findings within normal limits unless otherwise noted below.  face x 2 (2) Pink scaly macules  L neck x 1 Stuck on waxy paps with erythema   Assessment & Plan   History of Basal Cell Carcinoma of the Skin - No evidence of recurrence today - Recommend regular full body skin exams - Recommend daily broad spectrum sunscreen SPF 30+ to sun-exposed areas, reapply every 2 hours as needed.  - Call if any new or changing lesions are noted between office visits  - R sup helix, R neck, back  Lentigines - Scattered tan macules - Due to sun exposure - Benign-appearing, observe - Recommend daily broad spectrum sunscreen SPF 30+ to sun-exposed areas, reapply every 2 hours as needed. - Call for any changes - upper back  Seborrheic Keratoses - Stuck-on, waxy, tan-brown papules and/or plaques  - Benign-appearing - Discussed benign etiology and prognosis. - Observe -  Call for any changes - back  Melanocytic Nevi - Tan-brown and/or pink-flesh-colored symmetric macules and papules - Benign appearing on exam today - Observation - Call clinic for new or changing moles - Recommend daily use of broad spectrum spf 30+ sunscreen to sun-exposed areas.  - trunk Hemangiomas - Red papules - Discussed benign nature - Observe - Call for any changes - trunk  Actinic Damage - Chronic condition, secondary to cumulative UV/sun exposure - diffuse scaly erythematous macules with underlying dyspigmentation - Recommend daily broad spectrum sunscreen SPF 30+ to sun-exposed areas, reapply every 2 hours as needed.  - Staying in the shade or wearing long sleeves, sun glasses (UVA+UVB protection) and wide brim hats (4-inch brim around the entire circumference of the hat) are also recommended for sun protection.  - Call for new or changing lesions.  Skin cancer screening performed today.   Family history of skin cancer - what type(s): BCC - who affected: Father  AK (actinic keratosis) (2) face x 2  Destruction of lesion - face x 2 Complexity: simple   Destruction method: cryotherapy   Informed consent: discussed and consent obtained   Timeout:  patient name, date of birth, surgical site, and procedure verified Lesion destroyed using liquid nitrogen: Yes   Region frozen until ice ball extended beyond lesion: Yes   Outcome: patient tolerated procedure well with no complications   Post-procedure details: wound care instructions given    Inflamed seborrheic keratosis L neck x 1  Symptomatic, irritating, patient would  like treated.   Destruction of lesion - L neck x 1 Complexity: simple   Destruction method: cryotherapy   Informed consent: discussed and consent obtained   Timeout:  patient name, date of birth, surgical site, and procedure verified Lesion destroyed using liquid nitrogen: Yes   Region frozen until ice ball extended beyond lesion: Yes    Outcome: patient tolerated procedure well with no complications   Post-procedure details: wound care instructions given     Return in about 1 year (around 04/26/2023) for TBSE, Hx of BCC, Hx of AKs.  I, Othelia Pulling, RMA, am acting as scribe for Sarina Ser, MD . Documentation: I have reviewed the above documentation for accuracy and completeness, and I agree with the above.  Sarina Ser, MD

## 2022-05-06 ENCOUNTER — Encounter: Payer: Self-pay | Admitting: Dermatology

## 2022-05-22 ENCOUNTER — Emergency Department: Payer: BC Managed Care – PPO

## 2022-05-22 ENCOUNTER — Telehealth: Payer: Self-pay | Admitting: Family Medicine

## 2022-05-22 ENCOUNTER — Encounter: Payer: Self-pay | Admitting: *Deleted

## 2022-05-22 ENCOUNTER — Other Ambulatory Visit: Payer: Self-pay

## 2022-05-22 DIAGNOSIS — R748 Abnormal levels of other serum enzymes: Secondary | ICD-10-CM | POA: Diagnosis not present

## 2022-05-22 DIAGNOSIS — R079 Chest pain, unspecified: Secondary | ICD-10-CM | POA: Diagnosis not present

## 2022-05-22 DIAGNOSIS — R0789 Other chest pain: Secondary | ICD-10-CM | POA: Diagnosis not present

## 2022-05-22 DIAGNOSIS — Z20822 Contact with and (suspected) exposure to covid-19: Secondary | ICD-10-CM | POA: Insufficient documentation

## 2022-05-22 LAB — CBC WITH DIFFERENTIAL/PLATELET
Abs Immature Granulocytes: 0.02 10*3/uL (ref 0.00–0.07)
Basophils Absolute: 0.1 10*3/uL (ref 0.0–0.1)
Basophils Relative: 1 %
Eosinophils Absolute: 0.2 10*3/uL (ref 0.0–0.5)
Eosinophils Relative: 2 %
HCT: 48.2 % (ref 39.0–52.0)
Hemoglobin: 16.6 g/dL (ref 13.0–17.0)
Immature Granulocytes: 0 %
Lymphocytes Relative: 39 %
Lymphs Abs: 3.2 10*3/uL (ref 0.7–4.0)
MCH: 30.9 pg (ref 26.0–34.0)
MCHC: 34.4 g/dL (ref 30.0–36.0)
MCV: 89.8 fL (ref 80.0–100.0)
Monocytes Absolute: 0.9 10*3/uL (ref 0.1–1.0)
Monocytes Relative: 11 %
Neutro Abs: 3.7 10*3/uL (ref 1.7–7.7)
Neutrophils Relative %: 47 %
Platelets: 218 10*3/uL (ref 150–400)
RBC: 5.37 MIL/uL (ref 4.22–5.81)
RDW: 12.9 % (ref 11.5–15.5)
WBC: 8.1 10*3/uL (ref 4.0–10.5)
nRBC: 0 % (ref 0.0–0.2)

## 2022-05-22 LAB — BASIC METABOLIC PANEL
Anion gap: 5 (ref 5–15)
BUN: 14 mg/dL (ref 8–23)
CO2: 33 mmol/L — ABNORMAL HIGH (ref 22–32)
Calcium: 9.7 mg/dL (ref 8.9–10.3)
Chloride: 101 mmol/L (ref 98–111)
Creatinine, Ser: 1.17 mg/dL (ref 0.61–1.24)
GFR, Estimated: >60 mL/min (ref >60)
Glucose, Bld: 85 mg/dL (ref 70–99)
Potassium: 4.3 mmol/L (ref 3.5–5.1)
Sodium: 139 mmol/L (ref 135–145)

## 2022-05-22 LAB — RESP PANEL BY RT-PCR (RSV, FLU A&B, COVID)  RVPGX2
Influenza A by PCR: NEGATIVE
Influenza B by PCR: NEGATIVE
Resp Syncytial Virus by PCR: NEGATIVE
SARS Coronavirus 2 by RT PCR: NEGATIVE

## 2022-05-22 LAB — TROPONIN I (HIGH SENSITIVITY): Troponin I (High Sensitivity): 5 ng/L (ref <18)

## 2022-05-22 NOTE — Telephone Encounter (Addendum)
Per access nurse note pt agreed to comply with disposition and pt will go to [facility] ED. I spoke with pt and he is in line at [facility] ED to ck in now; pt wants to keep 05/29/22 at 8:30 AM for ED FU with Dr Darnell Level and pt would like to get 2nd shingrix injection at that visit also.  Sending note to Dr Darnell Level and Doreene Burke. Will teams [facility] CMA as well.

## 2022-05-22 NOTE — ED Triage Notes (Signed)
Pt reports luq pain for 6 weeks.  Pt also reports pain in middle of chest/  pt states pain is worse during the middle of night.  No n/v/d   pt alert  speech clear.

## 2022-05-22 NOTE — ED Provider Triage Note (Signed)
Emergency Medicine Provider Triage Evaluation Note  Christopher Contreras , a 66 y.o. male  was evaluated in triage.  Pt complains of left sided chest pain that has been ongoing intermittently for the past 4-6 weeks and daily for the last 1 week. Today it spread substernally. He notices it when he lays down at night, not during the day.  Has had intermittent episodes of tachycardia since he had COVID 3 years ago. No trouble breathing.  Review of Systems  Positive: CP Negative: Sob, leg swelling  Physical Exam  There were no vitals taken for this visit. Gen:   Awake, no distress   Resp:  Normal effort  MSK:   Moves extremities without difficulty  Other:    Medical Decision Making  Medically screening exam initiated at 4:32 PM.  Appropriate orders placed.  Christopher Contreras was informed that the remainder of the evaluation will be completed by another provider, this initial triage assessment does not replace that evaluation, and the importance of remaining in the ED until their evaluation is complete.     Christopher Old, PA-C 05/22/22 1645

## 2022-05-22 NOTE — Telephone Encounter (Signed)
Patient scheduled appointment through [place] for dull chest pain. He stated that it was having every few days but now its happening everyday. He stated that his blood pressure and heart rate has been fine. Sent over to access nurse.

## 2022-05-22 NOTE — Telephone Encounter (Signed)
[facility] - Client TELEPHONE ADVICE RECORD AccessNurse Patient Name: Christopher Contreras [facility] Gender: Male DOB: 1955/06/22 Age: 66 Y 1 M 13 D Return Phone Number: [phone] (Primary) Address: City/ State/ Zip: [place]  [address] Client [facility] - Client Client Site [facility] Provider Ria Bush - MD Contact Type Call Who Is Calling Patient / Member / Family / Caregiver Call Type Triage / Clinical Relationship To Patient Self Return Phone Number [phone] (Primary) Chief Complaint CHEST PAIN - pain, pressure, heaviness or tightness Reason for Call Symptomatic / Request for [place] states he has chest pain Translation No Nurse Assessment Nurse: Cox, RN, [facility] Date/Time (Eastern Time): 05/22/2022 3:35:53 PM Confirm and document reason for call. If symptomatic, describe symptoms. ---Caller states he has chest pain. He has a dull ache. Started 1 month ago on the left side under his ribcage. Now he has pain everyday and pain is present when he lays down. He has a hiatel hernia. Does the patient have any new or worsening symptoms? ---Yes Will a triage be completed? ---Yes Related visit to physician within the last 2 weeks? ---No Does the PT have any chronic conditions? (i.e. diabetes, asthma, this includes High risk factors for pregnancy, etc.) ---Yes List chronic conditions. ---long Covid, takes propanolol for tachycardia, on HCTZ-for inflammation in inner ear Is this a behavioral health or substance abuse call? ---No Guidelines Guideline Title Affirmed Question Affirmed Notes Nurse Date/Time (Eastern Time) Chest Pain [1] Chest pain (or "angina") comes and goes AND [2] is happening more often (increasing in frequency) or getting worse (increasing in severity) (Exception: Cox, RN, [facility] 62/70/3500 9:38:18 PM PLEASE NOTE: All  timestamps contained within this report are represented as [place] Standard Time. CONFIDENTIALTY NOTICE: This fax transmission is intended only for the addressee. It contains information that is legally privileged, confidential or otherwise protected from use or disclosure. If you are not the intended recipient, you are strictly prohibited from reviewing, disclosing, copying using or disseminating any of this information or taking any action in reliance on or regarding this information. If you have received this fax in error, please notify us immediately by telephone so that we can arrange for its return to [place]. Phone: [phone], Toll-Free: [phone], Fax: [phone] Page: 2 of 2 Call Id: [id number] Guidelines Guideline Title Affirmed Question Affirmed Notes Nurse Date/Time Eilene Ghazi Time) Chest pains that last only a few seconds.) Disp. Time Eilene Ghazi Time) Disposition Final User 05/22/2022 3:33:38 PM Send to Urgent Reinaldo Berber 05/22/2022 3:44:57 PM Go to ED Now Yes Cox, RN, [facility] Final Disposition 05/22/2022 3:44:57 PM Go to ED Now Yes Cox, RN, [facility] Caller Disagree/Comply Comply Caller Understands Yes PreDisposition InappropriateToAsk Care Advice Given Per Guideline GO TO ED NOW: * You need to be seen in the Emergency Department. ANOTHER ADULT SHOULD DRIVE: * It is better and safer if another adult drives instead of you. CARE ADVICE given per Chest Pain (Adult) guideline. NOTHING BY MOUTH: * Do not eat or drink anything for now. Comments User: Cordie Grice, RN Date/Time (Eastern Time): 05/22/2022 3:42:08 PM HR 76 bpm Referrals [employer]

## 2022-05-23 ENCOUNTER — Emergency Department
Admission: EM | Admit: 2022-05-23 | Discharge: 2022-05-23 | Disposition: A | Discharge status: Home / Self Care | Payer: BC Managed Care – PPO | Attending: Emergency Medicine | Admitting: Emergency Medicine

## 2022-05-23 DIAGNOSIS — R0789 Other chest pain: Secondary | ICD-10-CM

## 2022-05-23 LAB — LIPASE, BLOOD: Lipase: 56 U/L — ABNORMAL HIGH (ref 11–51)

## 2022-05-23 LAB — HEPATIC FUNCTION PANEL
ALT: 33 U/L (ref 0–44)
AST: 29 U/L (ref 15–41)
Albumin: 4.5 g/dL (ref 3.5–5.0)
Alkaline Phosphatase: 52 U/L (ref 38–126)
Bilirubin, Direct: 0.2 mg/dL (ref 0.0–0.2)
Indirect Bilirubin: 2.1 mg/dL — ABNORMAL HIGH (ref 0.3–0.9)
Total Bilirubin: 2.3 mg/dL — ABNORMAL HIGH (ref 0.3–1.2)
Total Protein: 7.9 g/dL (ref 6.5–8.1)

## 2022-05-23 LAB — TROPONIN I (HIGH SENSITIVITY): Troponin I (High Sensitivity): 5 ng/L (ref <18)

## 2022-05-23 NOTE — ED Notes (Signed)
Patient discharged to home per MD order. Patient in stable condition, and deemed medically cleared by ED provider for discharge. Discharge instructions reviewed with patient/family using "Teach Back"; verbalized understanding of medication education and administration, and information about follow-up care. Denies further concerns. ° °

## 2022-05-23 NOTE — ED Notes (Signed)
Pt states he has been having rib/chest pain called pmd and RN instructed him to come to the ED:

## 2022-05-23 NOTE — ED Provider Notes (Signed)
[facility] Provider Note    Event Date/Time   First MD Initiated Contact with Patient 05/23/22 [phone]     (approximate)  History   Chief Complaint: Chest Pain and Abdominal Pain  HPI  BANDON SHERWIN is a 66 y.o. male with a past medical history anxiety, [name], hyperlipidemia, tachycardia, presents to the emergency department for chest discomfort.  According to the patient for the past 2 to 3 weeks he has been experiencing intermittent chest discomfort mostly in the left chest but more recently he has experienced some anterior chest discomfort.  Patient states he called his doctor to arrange an appointment which she has scheduled for next week however the triage nurse called him back and was concerned given his symptoms of chest discomfort recommended to go to the emergency department for evaluation.  Patient denies any shortness of breath denies any pleuritic pain.  No diaphoresis or nausea.  No cardiac history besides tachycardia which he currently takes propranolol.  Physical Exam   Triage Vital Signs: ED Triage Vitals  Enc Vitals Group     BP 05/22/22 1637 136/89     Pulse Rate 05/22/22 1637 69     Resp 05/22/22 1637 18     Temp 05/22/22 1637 98.7 F (37.1 C)     Temp Source 05/22/22 1637 Oral     SpO2 05/22/22 1637 99 %     Weight 05/22/22 1635 205 lb (93 kg)     Height 05/22/22 1635 '5\' 8"'$  (1.727 m)     Head Circumference --      Peak Flow --      Pain Score 05/22/22 1634 2     Pain Loc --      Pain Edu? --      Excl. in [place]? --     Most recent vital signs: Vitals:   05/23/22 0235 05/23/22 0720  BP: 137/82 (!) 135/92  Pulse: 79 86  Resp: 17 18  Temp: 98.5 F (36.9 C)   SpO2: 99% 98%    General: Awake, no distress.  CV:  Good peripheral perfusion.  Regular rate and rhythm  Resp:  Normal effort.  Equal breath sounds bilaterally.  Abd:  No distention.  Soft, nontender.  No rebound or guarding.   ED Results / Procedures /  Treatments   EKG  EKG viewed and interpreted by myself shows normal sinus rhythm at 65 bpm with a narrow QRS, normal axis, normal intervals, no concerning ST changes.  RADIOLOGY  I have reviewed and interpreted the chest x-ray images.  No consolidation seen on my evaluation. Radiology has read the chest x-ray as negative   MEDICATIONS ORDERED IN ED: Medications - No data to display   IMPRESSION / MDM / [place] / ED COURSE  I reviewed the triage vital signs and the nursing notes.  Patient's presentation is most consistent with acute presentation with potential threat to life or bodily function.  Patient presents emergency department for chest pain intermittent over the past 2 to 3 weeks.  Denies any discomfort currently.  States he normally notices it at night when he is lying down.  Overall the patient appears well, no distress, reassuring vital signs, reassuring physical exam.  Patient's labs have resulted reassuring as well with troponin negative x 2, normal chemistry, normal CBC slight lipase elevation however not significantly changed from prior results.  Patient does have a slight bilirubin elevation but a history of Gilbert syndrome and this appears  unchanged from historical values.  COVID/flu/RSV is negative.  Given the patient's reassuring workup reassuring physical exam and reassuring vitals I believe the patient is safe for discharge home.  Patient states he has a cardiologist that he follows up with however has never had a stress test.  We will have the patient follow-up with his cardiologist for consideration of a stress test.  Patient has a PCP appointment next week.  Provided my typical chest pain return precautions.  FINAL CLINICAL IMPRESSION(S) / ED DIAGNOSES   Chest pain  Note:  This document was prepared using Dragon voice recognition software and may include unintentional dictation errors.   Harvest Dark, MD 05/23/22 [phone]

## 2022-05-23 NOTE — Discharge Instructions (Signed)
Please follow-up with your primary care doctor as well as your cardiologist for consideration of a stress test in the near future.  Return to the emergency department for any return of/worsening chest pain especially if associated with nausea sweating or shortness of breath.  Your workup in the emergency department today has shown reassuring results.

## 2022-05-29 ENCOUNTER — Ambulatory Visit (INDEPENDENT_AMBULATORY_CARE_PROVIDER_SITE_OTHER): Payer: BC Managed Care – PPO | Admitting: Nurse Practitioner

## 2022-05-29 ENCOUNTER — Ambulatory Visit: Payer: BC Managed Care – PPO | Admitting: Family Medicine

## 2022-05-29 ENCOUNTER — Encounter: Payer: Self-pay | Admitting: Nurse Practitioner

## 2022-05-29 VITALS — BP 128/78 | HR 70 | Ht 68.0 in | Wt 201.0 lb

## 2022-05-29 DIAGNOSIS — R0789 Other chest pain: Secondary | ICD-10-CM | POA: Diagnosis not present

## 2022-05-29 DIAGNOSIS — R748 Abnormal levels of other serum enzymes: Secondary | ICD-10-CM

## 2022-05-29 LAB — LIPASE: Lipase: 36 U/L (ref 11.0–59.0)

## 2022-05-29 NOTE — Assessment & Plan Note (Signed)
Mildly elevated on emergency department blood work.  Patient does not drink alcohol.  Repeat today, pending result

## 2022-05-29 NOTE — Assessment & Plan Note (Signed)
Emergency department follow-up.  Did review labs EKG and imaging.  Will have patient take PPI daily for 2 to 4 weeks to see if this helps with discomfort.  Did give patient information in regards to cardiology to schedule follow-up

## 2022-05-29 NOTE — Progress Notes (Signed)
   Acute Office Visit  Subjective:     Patient ID: Christopher Contreras, male    DOB: January 12, 1956, 67 y.o.   MRN: [id number]  Chief Complaint  Patient presents with   Follow-up    Chest pain    HPI Patient is in today for ed visit follow up   He was seen on 05/23/2022 at [facility]. They did a CXR along with lab work and EKG. Troponin were negative. All other lab work was unremarkable except lipase. He is here for a follow up.   States that it had been going on for several months. States that over the past 2 weeks it became more constant. States that it is a dull ache. State sthat palpation and movement does not effect it. States that when he is horizontal he feels it more. States that when he lays back it worses. States thatsince coivd he has had trouble with food.   States that he does have omeprazole that he takes once a week. States that he has tried pepcid that does not help with the symptoms. Does not drink alcohol. Has not tried anything over the counter  Patient states that he was seen by Dr. Lauree Chandler in the past but has been a few years.  States the EDP mentioned a possible stress test.   Review of Systems  Constitutional:  Negative for chills and fever.  Respiratory:  Positive for shortness of breath (baseline).   Cardiovascular:  Positive for chest pain. Negative for palpitations.  Gastrointestinal:  Positive for abdominal pain. Negative for nausea and vomiting.       BM daily  Yesterday BM that was normal         Objective:    BP 128/78   Pulse 70   Ht '5\' 8"'$  (1.727 m)   Wt 201 lb (91.2 kg)   SpO2 98%   BMI 30.56 kg/m    Physical Exam Vitals and nursing note reviewed.  Constitutional:      Appearance: Normal appearance.  Cardiovascular:     Rate and Rhythm: Normal rate and regular rhythm.     Heart sounds: Normal heart sounds.  Pulmonary:     Breath sounds: Normal breath sounds.  Abdominal:     General: Bowel sounds are normal. There is no  distension.     Palpations: There is no mass.     Tenderness: There is no abdominal tenderness.     Hernia: No hernia is present.  Neurological:     Mental Status: He is alert.     No results found for any visits on 05/29/22.      Assessment & Plan:   Problem List Items Addressed This Visit       Other   Atypical chest pain - Primary    Emergency department follow-up.  Did review labs EKG and imaging.  Will have patient take PPI daily for 2 to 4 weeks to see if this helps with discomfort.  Did give patient information in regards to cardiology to schedule follow-up      Elevated lipase    Mildly elevated on emergency department blood work.  Patient does not drink alcohol.  Repeat today, pending result      Relevant Orders   Lipase    No orders of the defined types were placed in this encounter.   Return if symptoms worsen or fail to improve.  Romilda Garret, NP

## 2022-05-29 NOTE — Progress Notes (Signed)
Cardiology Office Note    Date:  05/30/2022   ID:  Zymiere, Trostle 16-Sep-1955, MRN [id number]  PCP:  Ria Bush, MD  Cardiologist:  Lauree Chandler, MD  Electrophysiologist:  None   Chief Complaint: chest pain  History of Present Illness:   Christopher Contreras is a 67 y.o. male with history of anxiety, prostate CA, Gilbert syndrome, HLD, mild LVH, Meniere's disease/vestibular injury here for f/u of chest pain. He was previously evaluated by Dr. Angelena Form for chest pain, tachycardia, and elevated BP in 09/2019. He had had Covid in 05/2019 and was feeling poorly thereafter. At the time he noticed his HR tended to remain elevated after he ceased activity. He has been on propranolol with resolution of palpitations. Echo 09/2019 showed EF 60-65%, G1DD, mild LVH of BSS. Coronary CTA 10/2019 showed CAC 0, no evidence of CAD.   He was seen in the ED 12/26-12/27/23 with chest discomfort. He states this has been going on for about 2 months, initially noticed around the time he was doing a lot of bagging of leaves, so he assumed MSK. It was a fairly focal discomfort in his left axilla/left lower chest area though not worse with inspiration, palpation, or movement. It seemed to be worse at night lying down and at times after eating as well. It is not specifically provoked by exertion. At first symptoms were intemittent then became more persistent lasting for longer periods of time throughout the span of a few days prompting ER evaluation with negative troponins and unchanged EKG. He also noticed some migration of the discomfort across his front chest and to the right side as well. Lipase was marginally elevated at 56. He saw PCP back and was started back on PPI daily instead of PRN that he was doing. Symptoms have eased off but he still continues to feel this intermittently, lasting for minutes, last episode earlier today. He also reports over the last few months he had seen ENT for episodic  dizziness, mainly positional, which was felt to be related to old vestibular injury/Meniere's disease, with improvement with HCTZ + addition of PRN prednisone.   Labwork independently reviewed: 04/2022 troponins neg, elevated T Bili chronic appearing, o/w LFTS ok, CBC wnl, K 4.3, Cr 1.17 06/2021 D-dimer wnl, TSH wnl, LDL 168, trig 79 - lipids by PCP  Cardiology Studies:   Studies reviewed are outlined and summarized above. Reports may be included below. Otherwise see EMR.  Cor CT 10/2019 ADDENDUM REPORT: 11/11/2019 22:37   CLINICAL DATA:  67 year old male with hypertension and chest pain.   EXAM: Cardiac/Coronary  CTA   TECHNIQUE: The patient was scanned on a [employer].   FINDINGS: A 100 kV prospective scan was triggered in the descending thoracic aorta at 111 HU's. Axial non-contrast 3 mm slices were carried out through the heart. The data set was analyzed on a dedicated work station and scored using the [place]. Gantry rotation speed was 250 msecs and collimation was .6 mm. 100 mg of PO Metoprolol and 0.8 mg of sl NTG was given. The 3D data set was reconstructed in 5% intervals of the 67-82 % of the R-R cycle. Diastolic phases were analyzed on a dedicated work station using MPR, MIP and VRT modes. The patient received 80 cc of contrast.   Aorta: Normal size. Mild diffuse atherosclerotic plaque and calcifications. No dissection.   Aortic Valve:  Trileaflet.  Trivial calcifications.   Coronary Arteries:  Normal coronary origin.  Left dominance.  RCA is a medium lumen non-dominant artery that has no plaque.   Left main is a short artery that gives rise to LAD and LCX arteries. Left main has no plaque.   LAD is a large vessel that gives rise to two large diagonal arteries and has no plaque.   D1,2 are large arteries with no plaque.   LCX is a large dominant artery that gives rise to two OM branches and PDA. PLA is not visualized. There is no  significant plaque.   Other findings:   Normal pulmonary vein drainage into the left atrium.   Normal left atrial appendage without a thrombus.   Normal size of the pulmonary artery.   IMPRESSION: 1. Coronary calcium score of 0. This was 0 percentile for age and sex matched control.   2. Normal coronary origin with left dominance.   3. CAD-RADS 0. No evidence of CAD (0%). Consider non-atherosclerotic causes of chest pain.     Electronically Signed   By: Ena Dawley   On: 11/11/2019 22:37  EXAM: OVER-READ INTERPRETATION  CT CHEST   The following report is an over-read performed by radiologist Dr. Rolm Baptise of [facility], [place] on 11/11/2019. This over-read does not include interpretation of cardiac or coronary anatomy or pathology. The coronary CTA interpretation by the cardiologist is attached.   COMPARISON:  None.   FINDINGS: Vascular: Heart is normal size.  Aorta normal caliber.   Mediastinum/Nodes: No adenopathy.  Calcified left hilar lymph nodes.   Lungs/Pleura: No confluent opacities or effusions.   Upper Abdomen: Imaging into the upper abdomen shows no acute findings.   Musculoskeletal: Chest wall soft tissues are unremarkable. No acute bony abnormality.   IMPRESSION: Old granulomatous disease.  No acute findings.   Electronically Signed: By: Rolm Baptise M.D. On: 11/11/2019 19:31   2D echo 09/2019   1. Left ventricular ejection fraction, by estimation, is 60 to 65%. The  left ventricle has normal function. The left ventricle has no regional  wall motion abnormalities. There is mild left ventricular hypertrophy of  the basal-septal segment. Left  ventricular diastolic parameters are consistent with Grade I diastolic  dysfunction (impaired relaxation).   2. Right ventricular systolic function is normal. The right ventricular  size is normal. Tricuspid regurgitation signal is inadequate for assessing  PA pressure.   3. The mitral  valve is normal in structure. No evidence of mitral valve  regurgitation. No evidence of mitral stenosis.   4. The aortic valve is normal in structure. Aortic valve regurgitation is  not visualized. No aortic stenosis is present.   5. The inferior vena cava is normal in size with greater than 50%  respiratory variability, suggesting right atrial pressure of 3 mmHg.        Past Medical History:  Diagnosis Date   Abdominal discomfort, epigastric 07/20/2016   Anxiety    ANXIETY 12/27/2008   Qualifier: Diagnosis of  By: Council Mechanic MD, Hilaria Ota    Basal cell carcinoma 10/07/2019   Right sup. helix. Superficial and nodular.    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   Heartburn 08/16/2019   Hepatitis A was in 9th grade   HLD (hyperlipidemia) 2/08   Personal history of other infectious and parasitic disease    reports this as a hepatitis A infection in junior HS , denies any liver disorder    Prostate cancer ([place])    Pseudocholinesterase deficiency    patient reports this runs in his family ; but is unsure if he has the trait; see anethesia history for family history    Shortness of breath 08/16/2019   Vestibular disorder, left    ear ; injury occurred in the [place]    Vestibular dysfunction of left ear 08/16/2019   Residual since [place]    Past Surgical History:  Procedure Laterality Date   BASAL CELL CARCINOMA EXCISION  1/99   right face   CATARACT EXTRACTION, BILATERAL  about 2-4 years  ago   with bilateral lens placement   COLONOSCOPY  03/29/01   Diverticulosis   COLONOSCOPY  02/15/10   Divertics-Dr. Henrene Pastor   COLONOSCOPY  02/2020   1 TA, rpt 5 yrs ([facility])   ESOPHAGOGASTRODUODENOSCOPY  02/2020   4cm HH noninflamed, normal EGD otherwise ([facility])   LYMPHADENECTOMY Bilateral 09/15/2018   Procedure: LYMPHADENECTOMY, PELVIC;  Surgeon: Raynelle Bring, MD;  Location: [facility];  Service: Urology;  Laterality: Bilateral;   ROBOT ASSISTED LAPAROSCOPIC RADICAL PROSTATECTOMY N/A 09/15/2018   Procedure: XI ROBOTIC ASSISTED LAPAROSCOPIC RADICAL PROSTATECTOMY LEVEL 2;  Raynelle Bring, MD)   VASECTOMY  pre 1999   Dr. Rogers Blocker    Current Medications: Current Meds  Medication Sig   Ascorbic Acid (VITAMIN C PO) Take by mouth.   B Complex Vitamins (VITAMIN B COMPLEX PO) Take by mouth.   cetirizine (ZYRTEC) 10 MG tablet Take 1 tablet (10 mg total) by mouth daily.   COENZYME Q10 PO Take by mouth.   hydrochlorothiazide (HYDRODIURIL) 12.5 MG tablet Take 12.5 mg by mouth daily.   MAGNESIUM PO Take by mouth.   meclizine (ANTIVERT) 25 MG tablet Take 12.5 mg by mouth 3 (three) times daily as needed for dizziness.   MISC NATURAL PRODUCTS EX Apply topically. Lifewave X39 patches   MISC NATURAL PRODUCTS PO Take by mouth. Nattakinase   MISC NATURAL PRODUCTS PO Take by mouth. Histamine digest   MISC NATURAL PRODUCTS PO Take by mouth. Flora Udo's oil 3-6-9   MISC NATURAL PRODUCTS PO Take 1 capsule by mouth daily. NADH 60 mg daily   omeprazole (PRILOSEC) 40 MG capsule Take 1 capsule (40 mg total) by mouth daily. For 3 weeks then as needed   predniSONE (DELTASONE) 10 MG tablet Take 10 mg by mouth as needed. Patient takes once a week as needed for Meniere's disease   propranolol (INDERAL) 20 MG tablet Take 1 tablet (20 mg total) by mouth 2 (two) times daily.   Vitamin D-Vitamin K (VITAMIN K2-VITAMIN D3 PO) Take by mouth.   [DISCONTINUED] ASPIRIN 81 PO Take by mouth.      Allergies:   Sulfonamide  derivatives   Social History   Socioeconomic History   Marital status: Married    Spouse name: Not on file   Number of children: 3   Years of education: Not on file   Highest education level: Not on file  Occupational History   Occupation: Works as an Glass blower/designer  Tobacco Use   Smoking status:  Never   Smokeless tobacco: Never  Vaping Use   Vaping Use: Never used  Substance and Sexual Activity   Alcohol use: Not Currently    Comment: Rare   Drug use: No   Sexual activity: Yes  Other Topics Concern   Not on file  Social History Narrative   Married; lives with wife. 3 sons   Retired [employer]   Occ: Glass blower/designer of [employer] Strain: Not on [employer] Insecurity: Not on file  Transportation Needs: Not on file  Physical Activity: Not on file  Stress: Not on file  Social Connections: Not on file     Family History:  The patient's family history includes Bone cancer in his maternal grandmother; Colon cancer (age of onset: 3) in his mother; Colon polyps in his sister and sister; Diabetes in his paternal grandfather and son; Heart attack in an other family member; Heart failure in his father and maternal grandfather; Hypertension in an other family member; Other in his father; Prostate cancer in his father.  ROS:   Please see the history of present illness.  All other systems are reviewed and otherwise negative.    EKG(s)/Additional Labs   EKG:  EKG is ordered today, personally reviewed, demonstrating NSR 64bpm, left axis deviation, poor R Wave progression, nonspecific TW changes similar to prior.  Recent Labs: 07/03/2021: TSH 1.06 05/22/2022: ALT 33; BUN 14; Creatinine, Ser 1.17; Hemoglobin 16.6; Platelets 218; Potassium 4.3; Sodium 139  Recent Lipid Panel    Component Value Date/Time   CHOL 226 (H) 07/03/2021 0849   TRIG 79.0 07/03/2021 0849   HDL 42.30 07/03/2021 0849   CHOLHDL 5 07/03/2021 0849   VLDL  15.8 07/03/2021 0849   LDLCALC 168 (H) 07/03/2021 0849   LDLDIRECT 145.9 03/27/2010 0818    PHYSICAL EXAM:    VS:  BP 112/68   Pulse 64   Ht '5\' 8"'$  (1.727 m)   Wt 202 lb 3.2 oz (91.7 kg)   SpO2 98%   BMI 30.74 kg/m   BMI: Body mass index is 30.74 kg/m.  GEN: Well nourished, well developed male in no acute distress HEENT: normocephalic, atraumatic Neck: no JVD, carotid bruits, or masses Cardiac: RRR; no murmurs, rubs, or gallops, no edema  Respiratory:  clear to auscultation bilaterally, normal work of breathing GI: soft, nontender, nondistended, + BS MS: no deformity or atrophy Skin: warm and dry, no rash or focal bony abnormality Neuro:  Alert and Oriented x 3, Strength and sensation are intact, follows commands Psych: euthymic mood, full affect  Wt Readings from Last 3 Encounters:  05/30/22 202 lb 3.2 oz (91.7 kg)  05/29/22 201 lb (91.2 kg)  05/22/22 205 lb (93 kg)     ASSESSMENT & PLAN:   1. Chest pain, atypical - symptoms atypical for cardiac pain. EKG shows NSR, slightly abnormal with poor R wave progression and TWI III but unchanged from prior. Recent troponins negative. Prior Cor CT 09/2019 showed no CAD whatsoever. We'll undertake ETT and hold HCTZ/propranolol day of the test. He will continue PPI daily as this seems to have helped. He is not tachycardic, tachypneic or hypoxic, and currently pain free. If cardiac workup negative recommend f/u with PCP for further evaluation of non-cardiac CP.   Shared Decision Making/Informed Consent The risks [chest pain, shortness of breath, cardiac arrhythmias, dizziness, blood pressure fluctuations, myocardial infarction, stroke/transient ischemic attack, and life-threatening complications (estimated to be 1 in 10,000)],  benefits (risk stratification, diagnosing coronary artery disease, treatment guidance) and alternatives of an exercise tolerance test were discussed in detail with Christopher Contreras and he agrees to proceed.  2.  Hyperlipidemia - this is being followed in primary care.  3. LVH - mild asymmetric basal septal hypertrophy noted on echocardiogram. I presume this is secondary to HTN versus standard variant, but given his atypical CP, episodic dizziness, we'll repeat echocardiogram to ensure no changes. He also had diastolic dysfunction on this study but has no evidence of HF on exam.  4. Dizziness - this has been suspected due to vestibular dysfunction, with improvement with addition of prednisone PRN to HCTZ. There is no associated syncope or palpitations. VS have been stable. Await echo. If symptoms escalate can consider Zio but given improvement in treatment for Meniere's will hold off.    Disposition: F/u with me in 6 weeks after testing.   Medication Adjustments/Labs and Tests Ordered: Current medicines are reviewed at length with the patient today.  Concerns regarding medicines are outlined above. Medication changes, Labs and Tests ordered today are summarized above and listed in the Patient Instructions accessible in Encounters.   Signed, Charlie Pitter, PA-C  05/30/2022 9:10 AM    [place] Phone: [phone]; Fax: [phone]

## 2022-05-29 NOTE — Patient Instructions (Addendum)
Nice to see you today I would start taking he omeprazole '20mg'$  daily for the next 2-4 weeks.  You can reach out your cardiologist and schedule an appointment    Address: [address] [place], [place] [address] Phone: [phone]  If you do not improve please schedule with Dr. Danise Mina

## 2022-05-30 ENCOUNTER — Encounter: Payer: Self-pay | Admitting: Physician Assistant

## 2022-05-30 ENCOUNTER — Ambulatory Visit: Payer: BC Managed Care – PPO | Attending: Physician Assistant | Admitting: Physician Assistant

## 2022-05-30 VITALS — BP 112/68 | HR 64 | Ht 68.0 in | Wt 202.2 lb

## 2022-05-30 DIAGNOSIS — R0789 Other chest pain: Secondary | ICD-10-CM | POA: Diagnosis not present

## 2022-05-30 DIAGNOSIS — E785 Hyperlipidemia, unspecified: Secondary | ICD-10-CM | POA: Diagnosis not present

## 2022-05-30 DIAGNOSIS — I517 Cardiomegaly: Secondary | ICD-10-CM | POA: Diagnosis not present

## 2022-05-30 DIAGNOSIS — R42 Dizziness and giddiness: Secondary | ICD-10-CM

## 2022-05-30 NOTE — Patient Instructions (Signed)
Medication Instructions:  Your physician recommends that you continue on your current medications as directed. Please refer to the Current Medication list given to you today.  *If you need a refill on your cardiac medications before your next appointment, please call your pharmacy*   Testing/Procedures: Your physician has requested that you have an echocardiogram. Echocardiography is a painless test that uses sound waves to create images of your heart. It provides your doctor with information about the size and shape of your heart and how well your heart's chambers and valves are working. This procedure takes approximately one hour. There are no restrictions for this procedure. Please do NOT wear cologne, perfume, aftershave, or lotions (deodorant is allowed). Please arrive 15 minutes prior to your appointment time.  Your physician has requested that you have an exercise tolerance test. For further information please visit [email or web address]. Please also follow instruction sheet, as given.     Follow-Up: At [facility], you and your health needs are our priority.  As part of our continuing mission to provide you with exceptional heart care, we have created designated Provider Care Teams.  These Care Teams include your primary Cardiologist (physician) and Advanced Practice Providers (APPs -  Physician Assistants and Nurse Practitioners) who all work together to provide you with the care you need, when you need it.  We recommend signing up for the patient portal called "MyChart".  Sign up information is provided on this After Visit Summary.  MyChart is used to connect with patients for Virtual Visits (Telemedicine).  Patients are able to view lab/test results, encounter notes, upcoming appointments, etc.  Non-urgent messages can be sent to your provider as well.   To learn more about what you can do with MyChart, go to [email or web address].    Your next appointment:   6  week(s)  The format for your next appointment:   In Person  Provider:   Melina Copa, PA-C        Other Instructions

## 2022-05-31 ENCOUNTER — Ambulatory Visit: Payer: BC Managed Care – PPO | Attending: Physician Assistant

## 2022-05-31 DIAGNOSIS — R0789 Other chest pain: Secondary | ICD-10-CM | POA: Diagnosis not present

## 2022-05-31 DIAGNOSIS — I517 Cardiomegaly: Secondary | ICD-10-CM | POA: Diagnosis not present

## 2022-05-31 LAB — EXERCISE TOLERANCE TEST
Angina Index: 0
Base ST Depression (mm): 0 mm
Duke Treadmill Score: 8
Estimated workload: 10.1
Exercise duration (min): 8 min
Exercise duration (sec): 2 s
MPHR: 156 {beats}/min
Peak HR: 160 {beats}/min
Percent HR: 102 %
RPE: 16
Rest HR: 67 {beats}/min
ST Depression (mm): 0 mm

## 2022-06-20 ENCOUNTER — Ambulatory Visit (HOSPITAL_COMMUNITY): Payer: BC Managed Care – PPO | Attending: Physician Assistant

## 2022-06-20 DIAGNOSIS — I517 Cardiomegaly: Secondary | ICD-10-CM | POA: Diagnosis not present

## 2022-06-20 DIAGNOSIS — R0789 Other chest pain: Secondary | ICD-10-CM | POA: Diagnosis not present

## 2022-06-20 LAB — ECHOCARDIOGRAM COMPLETE
AR max vel: 1.58 cm2
AV Area VTI: 1.6 cm2
AV Area mean vel: 1.58 cm2
AV Mean grad: 7 mmHg
AV Peak grad: 13.7 mmHg
Ao pk vel: 1.85 m/s
Area-P 1/2: 2.95 cm2
S' Lateral: 2.8 cm

## 2022-06-28 ENCOUNTER — Ambulatory Visit: Payer: BC Managed Care – PPO

## 2022-06-29 ENCOUNTER — Ambulatory Visit (INDEPENDENT_AMBULATORY_CARE_PROVIDER_SITE_OTHER): Payer: BC Managed Care – PPO | Admitting: Family Medicine

## 2022-06-29 ENCOUNTER — Encounter: Payer: Self-pay | Admitting: Family Medicine

## 2022-06-29 VITALS — BP 130/74 | HR 72 | Temp 97.5°F | Ht 68.0 in | Wt 201.1 lb

## 2022-06-29 DIAGNOSIS — Z23 Encounter for immunization: Secondary | ICD-10-CM | POA: Diagnosis not present

## 2022-06-29 DIAGNOSIS — H8102 Meniere's disease, left ear: Secondary | ICD-10-CM | POA: Diagnosis not present

## 2022-06-29 DIAGNOSIS — R0789 Other chest pain: Secondary | ICD-10-CM | POA: Diagnosis not present

## 2022-06-29 MED ORDER — OMEPRAZOLE 40 MG PO CPDR: 40.0000 mg | DELAYED_RELEASE_CAPSULE | Freq: Every day | ORAL | 3 refills | Status: DC

## 2022-06-29 MED ORDER — SUCRALFATE 1 G PO TABS: 1.0000 g | ORAL_TABLET | Freq: Three times a day (TID) | ORAL | 1 refills | Status: DC

## 2022-06-29 NOTE — Progress Notes (Signed)
Patient ID: Christopher Contreras, male    DOB: March 21, 1956, 67 y.o.   MRN: [id number]  This visit was conducted in person.  BP 130/74   Pulse 72   Temp (!) 97.5 F (36.4 C) (Temporal)   Ht '5\' 8"'$  (1.727 m)   Wt 201 lb 2 oz (91.2 kg)   SpO2 97%   BMI 30.58 kg/m    CC: continued L chest pain  Subjective:   HPI: Christopher Contreras is a 67 y.o. male presenting on 06/29/2022 for Chest Pain (C/o ongoing L chest pain. Seen on 05/23/22 at [facility] ED, dx atypical chest pain. Had f/u with cards. )   Known long COVID syndrome with residual exertional dyspnea.   Over the last several months has noted L sided chest pain present, acutely worse recently, described as dull ache. Initially seen at ER with reassuring evaluation including normal respiratory panel, EKG, CXR and troponin levels. Subsequently seen by [facility] NP in office then cardiology office.   Describes dull left sided chest ache that starts below nipple, but since then it can be anywhere along anterior chest wall. Last night was mid sternal pain, lasted 15 min until he fell asleep. Noticed mainly after eating or when he lays supine. No relation to type of foods. Not reproducible with palpation or exertional. Some dyspnea with this.   Prior coronary CT 09/2019 showed no CAD, calcium score of zero.  He underwent repeat cardiac workup which included normal echocardiogram and normal treadmill stress trest.   Daily omeprazole '20mg'$  seemed to help. This worked better than pepcid.  Notes occasional food and pills getting stuck but not significant issue.  EGD 02/2020 - HH  Last saw pulm ([place]) 07/2021. Plan at that time was 6 weeks of sub maximal exertion 30 min daily and planned CPST as next step. Started propranolol '20mg'$  1/2 tab BID which has helped tachycardia.   Known Meniere's of left ear followed by ENT Dr Pryor Ochoa - on hctz 12.'5mg'$  daily + prednisone PRN.      Relevant past medical, surgical, family and social history reviewed and updated as  indicated. Interim medical history since our last visit reviewed. Allergies and medications reviewed and updated. Outpatient Medications Prior to Visit  Medication Sig Dispense Refill   Ascorbic Acid (VITAMIN C PO) Take by mouth.     B Complex Vitamins (VITAMIN B COMPLEX PO) Take by mouth.     cetirizine (ZYRTEC) 10 MG tablet Take 1 tablet (10 mg total) by mouth daily. 30 tablet 11   COENZYME Q10 PO Take by mouth.     hydrochlorothiazide (HYDRODIURIL) 12.5 MG tablet Take 12.5 mg by mouth daily.     MAGNESIUM PO Take by mouth.     meclizine (ANTIVERT) 25 MG tablet Take 12.5 mg by mouth 3 (three) times daily as needed for dizziness.     MISC NATURAL PRODUCTS PO Take by mouth. Nattakinase     MISC NATURAL PRODUCTS PO Take by mouth. Histamine digest     MISC NATURAL PRODUCTS PO Take by mouth. Flora Udo's oil 3-6-9     predniSONE (DELTASONE) 10 MG tablet Take 10 mg by mouth as needed. Patient takes once a week as needed for Meniere's disease     Vitamin D-Vitamin K (VITAMIN K2-VITAMIN D3 PO) Take by mouth.     omeprazole (PRILOSEC) 40 MG capsule Take 1 capsule (40 mg total) by mouth daily. For 3 weeks then as needed (Patient taking differently: Take 40 mg by mouth  daily. Takes 20 mg daily) 30 capsule 3   propranolol (INDERAL) 20 MG tablet Take 1 tablet (20 mg total) by mouth 2 (two) times daily. (Patient taking differently: Take 20 mg by mouth 2 (two) times daily. Takes 1/2 tablet once a daily) 60 tablet 11   propranolol (INDERAL) 20 MG tablet Take 0.5 tablets (10 mg total) by mouth 2 (two) times daily.     MISC NATURAL PRODUCTS EX Apply topically. Lifewave X39 patches     MISC NATURAL PRODUCTS PO Take 1 capsule by mouth daily. NADH 60 mg daily     No facility-administered medications prior to visit.     Per HPI unless specifically indicated in ROS section below Review of Systems  Objective:  BP 130/74   Pulse 72   Temp (!) 97.5 F (36.4 C) (Temporal)   Ht '5\' 8"'$  (1.727 m)   Wt 201 lb 2  oz (91.2 kg)   SpO2 97%   BMI 30.58 kg/m   Wt Readings from Last 3 Encounters:  06/29/22 201 lb 2 oz (91.2 kg)  05/30/22 202 lb 3.2 oz (91.7 kg)  05/29/22 201 lb (91.2 kg)      Physical Exam Vitals and nursing note reviewed.  Constitutional:      Appearance: Normal appearance. He is not ill-appearing.  HENT:     Head: Normocephalic and atraumatic.     Mouth/Throat:     Comments: Wearing mask Eyes:     Extraocular Movements: Extraocular movements intact.     Conjunctiva/sclera: Conjunctivae normal.     Pupils: Pupils are equal, round, and reactive to light.  Neck:     Thyroid: No thyroid mass or thyromegaly.  Cardiovascular:     Rate and Rhythm: Normal rate and regular rhythm.     Pulses: Normal pulses.     Heart sounds: Normal heart sounds. No murmur heard. Pulmonary:     Effort: Pulmonary effort is normal. No respiratory distress.     Breath sounds: Normal breath sounds. No wheezing, rhonchi or rales.  Abdominal:     General: Bowel sounds are normal. There is no distension.     Palpations: Abdomen is soft. There is no mass.     Tenderness: There is no abdominal tenderness. There is no guarding or rebound.     Hernia: No hernia is present.  Musculoskeletal:     Cervical back: Normal range of motion and neck supple. No rigidity.  Lymphadenopathy:     Cervical: No cervical adenopathy.  Skin:    General: Skin is warm and dry.     Findings: No rash.  Neurological:     Mental Status: He is alert.  Psychiatric:        Mood and Affect: Mood normal.        Behavior: Behavior normal.       Results for orders placed or performed in visit on 06/20/22  ECHOCARDIOGRAM COMPLETE  Result Value Ref Range   Area-P 1/2 2.95 cm2   S' Lateral 2.80 cm   AV Area mean vel 1.58 cm2   AR max vel 1.58 cm2   AV Area VTI 1.60 cm2   Ao pk vel 1.85 m/s   AV Mean grad 7.0 mmHg   AV Peak grad 13.7 mmHg    Assessment & Plan:   Problem List Items Addressed This Visit     Atypical  chest pain - Primary    S/p reassuring cardiac workup. Does not sound MSK or pulm cause.  Anticipate GI  related - worse with meals in known [place] by EGD 2021.  He's been taking omeprazole '20mg'$  daily. Will Rx omeprazole '40mg'$  daily 30 min before lunch for 3 wks then PRN, will also Rx carafate QID AC&HS x 1 wk then PRN.  Update with effect.  Consider return to GI if no better.       Meniere disease, left    Stable period on hctz 12.'5mg'$  daily + prednisone PRN flare.       Other Visit Diagnoses     Need for shingles vaccine       Relevant Orders   Varicella-zoster vaccine IM (Completed)        Meds ordered this encounter  Medications   omeprazole (PRILOSEC) 40 MG capsule    Sig: Take 1 capsule (40 mg total) by mouth daily. For 3 weeks then as needed, take 30 min before lunch    Dispense:  30 capsule    Refill:  3   sucralfate (CARAFATE) 1 g tablet    Sig: Take 1 tablet (1 g total) by mouth 4 (four) times daily -  with meals and at bedtime.    Dispense:  60 tablet    Refill:  1    Orders Placed This Encounter  Procedures   Varicella-zoster vaccine IM    Patient Instructions  2nd shingles shot today  We will treat chest pain as GI related - take omeprazole '40mg'$  daily for 3 weeks then as needed, take 30 min before lunch Take Carafate tablets with larger meals and at bedtime for 1 week then as needed Update [place] with effect, or if ongoing difficulty swallowing to consider return to GI for further evaluation.   Follow up plan: Return in about 6 weeks (around 08/10/2022), or if symptoms worsen or fail to improve, for annual exam, prior fasting for blood work.  Ria Bush, MD

## 2022-06-29 NOTE — Patient Instructions (Addendum)
2nd shingles shot today  We will treat chest pain as GI related - take omeprazole '40mg'$  daily for 3 weeks then as needed, take 30 min before lunch Take Carafate tablets with larger meals and at bedtime for 1 week then as needed Update [place] with effect, or if ongoing difficulty swallowing to consider return to GI for further evaluation.

## 2022-06-29 NOTE — Assessment & Plan Note (Signed)
Stable period on hctz 12.'5mg'$  daily + prednisone PRN flare.

## 2022-06-29 NOTE — Assessment & Plan Note (Signed)
S/p reassuring cardiac workup. Does not sound MSK or pulm cause.  Anticipate GI related - worse with meals in known HH by EGD 2021.  He's been taking omeprazole '20mg'$  daily. Will Rx omeprazole '40mg'$  daily 30 min before lunch for 3 wks then PRN, will also Rx carafate QID AC&HS x 1 wk then PRN.  Update with effect.  Consider return to GI if no better.

## 2022-07-12 NOTE — Progress Notes (Signed)
Cardiology Clinic Note   Patient Name: Christopher Contreras Date of Encounter: 07/16/2022  Primary Care Provider:  Ria Bush, MD Primary Cardiologist:  Lauree Chandler, MD  Patient Profile    Christopher Contreras is a 67 y.o. male with a past medical history of chest pain, palpitations, Mnire's disease/vestibular injury hypertension, hyperlipidemia (followed by PCP), prostate cancer, and Christopher Contreras Syndrome who presents to the clinic today for 6 week follow up after cardiac testing.  Past Medical History    Past Medical History:  Diagnosis Date   Abdominal discomfort, epigastric 07/20/2016   Anxiety    ANXIETY 12/27/2008   Qualifier: Diagnosis of  By: Council Mechanic MD, Hilaria Ota    Basal cell carcinoma 10/07/2019   Right sup. helix. Superficial and nodular.    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   Heartburn 08/16/2019   Hepatitis A was in 9th grade   HLD (hyperlipidemia) 2/08   Personal history of other infectious and parasitic disease    reports this as a hepatitis A infection in junior HS , denies any liver disorder    Prostate cancer ([place])    Pseudocholinesterase deficiency    patient reports this runs in his family ; but is unsure if he has the trait; see anethesia history for family history    Shortness of breath 08/16/2019   Vestibular disorder, left    ear ; injury occurred in the [place]    Vestibular dysfunction of left ear 08/16/2019   Residual since [place]   Past  Surgical History:  Procedure Laterality Date   BASAL CELL CARCINOMA EXCISION  1/99   right face   CATARACT EXTRACTION, BILATERAL  about 2-4 years ago   with bilateral lens placement   COLONOSCOPY  03/29/01   Diverticulosis   COLONOSCOPY  02/15/10   Divertics-Dr. Henrene Pastor   COLONOSCOPY  02/2020   1 TA, rpt 5 yrs ([place])   ESOPHAGOGASTRODUODENOSCOPY  02/2020   4cm HH noninflamed, normal EGD otherwise ([place])   LYMPHADENECTOMY Bilateral 09/15/2018   Procedure: LYMPHADENECTOMY, PELVIC;  Surgeon: Raynelle Bring, MD;  Location: [facility];  Service: Urology;  Laterality: Bilateral;   ROBOT ASSISTED LAPAROSCOPIC RADICAL PROSTATECTOMY N/A 09/15/2018   Procedure: XI ROBOTIC ASSISTED LAPAROSCOPIC RADICAL PROSTATECTOMY LEVEL 2;  Raynelle Bring, MD)   VASECTOMY  pre 1999   Dr. Rogers Blocker    Allergies  Allergies  Allergen Reactions   Sulfonamide Derivatives     Unknown, childhood allergy    History of Present Illness    GERADO COMPANION has a past medical history of: Chest pain. Exercise stress test on 06/16/2022: Normal study without ST deviation, arrhythmia, or evidence of ischemia. Echo 06/20/2022: EF 65%, no significant abnormalities otherwise (no further LVH) Palpitations. Hypertension. Hyperlipidemia. Lipid panel 07/03/2021: LDL 168, HDL 42, TG 79, total 226.  Anxiety. Prostate cancer. Mnire's disease/vestibular injury. COVID-positive in January 2021.  Mr. Klever was first evaluated by Dr. Angelena Form on 10/19/2019 for hypertension and  chest pain at the request of Dr. Danise Mina, PCP.  Coronary CT showed a calcium score of 0.  Echo showed EF 60-65%, mild LVH basal-septal segment, grade I DD.  He presented to the emergency room on 05/22/2022 with a 2 to 3-week history of intermittent chest discomfort.  He had no EKG changes and troponin was negative x 2 so patient was discharged to follow-up with cardiology as an outpatient. He was last seen in the office by Melina Copa, PA-C on 05/30/2022 for  hospital follow-up.  He underwent reassuring testing as outlined above.  Today, patient Is doing well with no further complaints of chest pain. He reports he saw his PCP after he completed cardiac testing and after further conversations about his symptoms he was started on Carafate. Symptoms resolved with the first dose. He had one episode last night of the same pain before bed but it did not keep him from sleeping and it is gone this morning. Patient denies shortness of breath or dyspnea on exertion. Denies lower extremity edema, orthopnea, or PND. No palpitations. He reports propranolol manages tachycardia well. He did not feel the tachycardia but was alerted through his smart watch when it occurred. He was running 15 miles a week  prior to noticing the tachycardia but stopped secondary to it continuing even at rest. He has noticed since starting propranolol his heart rate will come down quickly with rest.    Home Medications    Current Meds  Medication Sig   Ascorbic Acid (VITAMIN C PO) Take by mouth.   B Complex Vitamins (VITAMIN B COMPLEX PO) Take by mouth.   cetirizine (ZYRTEC) 10 MG tablet Take 1 tablet (10 mg total) by mouth daily.   COENZYME Q10 PO Take by mouth.   hydrochlorothiazide (HYDRODIURIL) 12.5 MG tablet Take 12.5 mg by mouth daily.   MAGNESIUM PO Take by mouth.   meclizine (ANTIVERT) 25 MG tablet Take 12.5 mg by mouth 3 (three) times daily as needed for dizziness.   MISC NATURAL PRODUCTS PO Take by mouth. Nattakinase   MISC NATURAL PRODUCTS PO Take by mouth. Histamine digest   MISC NATURAL PRODUCTS PO Take by mouth. Flora Udo's oil 3-6-9   omeprazole (PRILOSEC) 40 MG capsule Take 1 capsule (40 mg total) by mouth daily. For 3 weeks then as needed, take 30 min before lunch   predniSONE (DELTASONE) 10 MG tablet Take 10 mg by mouth as needed. Patient takes once a week as needed for Meniere's disease   propranolol (INDERAL) 20 MG tablet Take 10 mg by mouth 2 (two) times daily. 20  mg (1 tablet) in the morning, 10 mg (0.5 tablet) in the evening   sucralfate (CARAFATE) 1 g tablet Take 1 tablet (1 g total) by mouth 4 (four) times daily -  with meals and at bedtime.   Vitamin D-Vitamin K (VITAMIN K2-VITAMIN D3 PO) Take by mouth.    Family History    Family History  Problem Relation Age of Onset   Prostate cancer Father        s/p prostatectomy   Other Father        Back pain with neuropathy   Heart failure Father    Colon cancer Mother 15   Colon polyps Sister    Colon polyps Sister    Heart attack Other        Maternal side   Hypertension Other        Both sides   Diabetes Son    Diabetes Paternal  Grandfather    Bone cancer Maternal Grandmother    Heart failure Maternal Grandfather    He indicated that his mother is deceased. He indicated that his father is deceased. He indicated that two of his four sisters are alive. He indicated that the status of his maternal grandmother is unknown. He indicated that the status of his maternal grandfather is unknown. He indicated that the status of his paternal grandfather is unknown. He indicated that the status of his son is unknown.   Social History    Social History   Socioeconomic History   Marital status: Married    Spouse name: Not on file   Number of children: 3   Years of education: Not on file   Highest education level: Not on file  Occupational History   Occupation: Works as an Glass blower/designer  Tobacco Use   Smoking status: Never   Smokeless tobacco: Never  Scientific laboratory technician Use: Never used  Substance and Sexual Activity   Alcohol use: Not Currently    Comment: Rare   Drug use: No   Sexual activity: Yes  Other Topics Concern   Not on file  Social History Narrative   Married; lives with wife. 3 sons   Retired [employer]   Occ: Glass blower/designer of [employer] Strain: Not on [employer] Insecurity: Not on file  Transportation Needs: Not on  file  Physical Activity: Not on file  Stress: Not on file  Social Connections: Not on file  Intimate Partner Violence: Not on file     Review of Systems    General:  No chills, fever, night sweats or weight changes.  Cardiovascular:  No chest pain, dyspnea on exertion, edema, orthopnea, palpitations, paroxysmal nocturnal dyspnea. Dermatological: No rash, lesions/masses Respiratory: No cough, dyspnea Urologic: No hematuria, dysuria Abdominal:   No nausea, vomiting, diarrhea, bright red blood per rectum, melena, or hematemesis Neurologic:  No visual changes, weakness, changes in mental status. All other systems reviewed and are otherwise negative except as noted above.  Physical Exam    VS:  BP 130/78   Pulse 69   Ht 5' 8"$  (1.727 m)   Wt 201 lb 3.2 oz (91.3 kg)   SpO2 99%   BMI 30.59 kg/m  , BMI Body mass index is 30.59 kg/m. GEN:  Well nourished, well developed, in no acute distress. HEENT: Normal. Neck: Supple, no JVD, carotid bruits, or masses. Cardiac: RRR, no murmurs, rubs, or gallops. No clubbing, cyanosis, edema.  Radials/DP/PT 2+ and equal bilaterally.  Respiratory:  Respirations regular and unlabored, clear to auscultation bilaterally. GI: Soft, nontender, nondistended. MS: No deformity or atrophy. Skin: Warm and dry, no rash. Neuro: Strength and sensation are intact. Psych: Normal affect.  Accessory Clinical Findings    Recent Labs: 05/22/2022: ALT 33; BUN 14; Creatinine, Ser 1.17; Hemoglobin 16.6; Platelets 218; Potassium 4.3; Sodium 139   Recent Lipid Panel    Component Value Date/Time   CHOL 226 (H) 07/03/2021 0849   TRIG 79.0 07/03/2021 0849   HDL 42.30 07/03/2021 0849   CHOLHDL 5 07/03/2021 0849   VLDL 15.8 07/03/2021 0849   LDLCALC 168 (H) 07/03/2021 0849   LDLDIRECT 145.9 03/27/2010 0818       ECG is not indicated today.   Assessment & Plan   Chest pain. Nuclear stress test and echo in January 2024 were normal. Patient reports no further  episodes since being started on  Carafate by PCP. He has a scheduled physical with PCP next month. No further workup is necessary at this time.  Palpitations. Well controlled with propranolol. He reports prior to developing tachycardia he was running 15 miles a week but has been concerned about resuming this activity so he has been walking instead. He reports if his heart rate does increase it will come down fairly quickly since starting the medication. Encouraged patient to begin a run/walk program and advance as tolerated if he is interested in resuming running. Continue propranolol.  Hypertension. BP today 130/78. Patient denies headaches or dizziness. Continue propranolol and hydrochlorothiazide.       Disposition: Return in 12 months or sooner as needed. Discussed PRN f/u but patient prefers to continue to see [place] annually.   Justice Britain. Camron Monday, DNP, NP-C     07/16/2022, 8:29 AM [place] [address] Office [phone] Fax [phone]

## 2022-07-16 ENCOUNTER — Encounter: Payer: Self-pay | Admitting: Student

## 2022-07-16 ENCOUNTER — Ambulatory Visit: Payer: BC Managed Care – PPO | Attending: Physician Assistant | Admitting: Student

## 2022-07-16 VITALS — BP 130/78 | HR 69 | Ht 68.0 in | Wt 201.2 lb

## 2022-07-16 DIAGNOSIS — R002 Palpitations: Secondary | ICD-10-CM | POA: Diagnosis not present

## 2022-07-16 DIAGNOSIS — R0789 Other chest pain: Secondary | ICD-10-CM | POA: Diagnosis not present

## 2022-07-16 DIAGNOSIS — I10 Essential (primary) hypertension: Secondary | ICD-10-CM

## 2022-07-16 NOTE — Patient Instructions (Signed)
Medication Instructions:  Your physician recommends that you continue on your current medications as directed. Please refer to the Current Medication list given to you today.  *If you need a refill on your cardiac medications before your next appointment, please call your pharmacy*   Lab Work: None ordered  If you have labs (blood work) drawn today and your tests are completely normal, you will receive your results only by: [place] (if you have MyChart) OR A paper copy in the mail If you have any lab test that is abnormal or we need to change your treatment, we will call you to review the results.   Testing/Procedures: None ordered   Follow-Up: At [facility], you and your health needs are our priority.  As part of our continuing mission to provide you with exceptional heart care, we have created designated Provider Care Teams.  These Care Teams include your primary Cardiologist (physician) and Advanced Practice Providers (APPs -  Physician Assistants and Nurse Practitioners) who all work together to provide you with the care you need, when you need it.  We recommend signing up for the patient portal called "MyChart".  Sign up information is provided on this After Visit Summary.  MyChart is used to connect with patients for Virtual Visits (Telemedicine).  Patients are able to view lab/test results, encounter notes, upcoming appointments, etc.  Non-urgent messages can be sent to your provider as well.   To learn more about what you can do with MyChart, go to [email or web address].    Your next appointment:   1 year(s)  Provider:   Lauree Chandler, MD     Other Instructions

## 2022-07-23 DIAGNOSIS — H8109 Meniere's disease, unspecified ear: Secondary | ICD-10-CM | POA: Diagnosis not present

## 2022-07-28 ENCOUNTER — Other Ambulatory Visit: Payer: Self-pay | Admitting: Family Medicine

## 2022-07-28 DIAGNOSIS — Z1159 Encounter for screening for other viral diseases: Secondary | ICD-10-CM

## 2022-07-28 DIAGNOSIS — E785 Hyperlipidemia, unspecified: Secondary | ICD-10-CM

## 2022-07-28 DIAGNOSIS — Z8546 Personal history of malignant neoplasm of prostate: Secondary | ICD-10-CM

## 2022-07-28 DIAGNOSIS — R1013 Epigastric pain: Secondary | ICD-10-CM

## 2022-07-31 ENCOUNTER — Other Ambulatory Visit: Payer: Self-pay

## 2022-07-31 MED ORDER — SUCRALFATE 1 G PO TABS: 1.0000 g | ORAL_TABLET | Freq: Three times a day (TID) | ORAL | 1 refills | Status: DC

## 2022-08-03 ENCOUNTER — Other Ambulatory Visit (INDEPENDENT_AMBULATORY_CARE_PROVIDER_SITE_OTHER): Payer: BC Managed Care – PPO

## 2022-08-03 DIAGNOSIS — Z8546 Personal history of malignant neoplasm of prostate: Secondary | ICD-10-CM | POA: Diagnosis not present

## 2022-08-03 DIAGNOSIS — E785 Hyperlipidemia, unspecified: Secondary | ICD-10-CM

## 2022-08-03 DIAGNOSIS — Z1159 Encounter for screening for other viral diseases: Secondary | ICD-10-CM

## 2022-08-03 LAB — COMPREHENSIVE METABOLIC PANEL
ALT: 23 U/L (ref 0–53)
AST: 21 U/L (ref 0–37)
Albumin: 4.2 g/dL (ref 3.5–5.2)
Alkaline Phosphatase: 54 U/L (ref 39–117)
BUN: 9 mg/dL (ref 6–23)
CO2: 32 mEq/L (ref 19–32)
Calcium: 9.4 mg/dL (ref 8.4–10.5)
Chloride: 101 mEq/L (ref 96–112)
Creatinine, Ser: 1.27 mg/dL (ref 0.40–1.50)
GFR: 59.79 mL/min — ABNORMAL LOW (ref >60.00)
Glucose, Bld: 91 mg/dL (ref 70–99)
Potassium: 3.9 mEq/L (ref 3.5–5.1)
Sodium: 140 mEq/L (ref 135–145)
Total Bilirubin: 2.8 mg/dL — ABNORMAL HIGH (ref 0.2–1.2)
Total Protein: 6.9 g/dL (ref 6.0–8.3)

## 2022-08-03 LAB — LIPID PANEL
Cholesterol: 205 mg/dL — ABNORMAL HIGH (ref 0–200)
HDL: 42.4 mg/dL (ref >39.00)
LDL Cholesterol: 135 mg/dL — ABNORMAL HIGH (ref 0–99)
NonHDL: 162.46
Total CHOL/HDL Ratio: 5
Triglycerides: 136 mg/dL (ref 0.0–149.0)
VLDL: 27.2 mg/dL (ref 0.0–40.0)

## 2022-08-03 LAB — PSA: PSA: 0 ng/mL — ABNORMAL LOW (ref 0.10–4.00)

## 2022-08-05 LAB — HEPATITIS C ANTIBODY: Hepatitis C Ab: NONREACTIVE

## 2022-08-10 ENCOUNTER — Encounter: Payer: Self-pay | Admitting: Family Medicine

## 2022-08-10 ENCOUNTER — Ambulatory Visit (INDEPENDENT_AMBULATORY_CARE_PROVIDER_SITE_OTHER): Payer: BC Managed Care – PPO | Admitting: Family Medicine

## 2022-08-10 VITALS — BP 126/70 | HR 71 | Temp 97.2°F | Ht 66.75 in | Wt 203.1 lb

## 2022-08-10 DIAGNOSIS — E7800 Pure hypercholesterolemia, unspecified: Secondary | ICD-10-CM

## 2022-08-10 DIAGNOSIS — H8102 Meniere's disease, left ear: Secondary | ICD-10-CM

## 2022-08-10 DIAGNOSIS — G4700 Insomnia, unspecified: Secondary | ICD-10-CM | POA: Insufficient documentation

## 2022-08-10 DIAGNOSIS — K219 Gastro-esophageal reflux disease without esophagitis: Secondary | ICD-10-CM

## 2022-08-10 DIAGNOSIS — H8192 Unspecified disorder of vestibular function, left ear: Secondary | ICD-10-CM

## 2022-08-10 DIAGNOSIS — Z8546 Personal history of malignant neoplasm of prostate: Secondary | ICD-10-CM

## 2022-08-10 DIAGNOSIS — U099 Post covid-19 condition, unspecified: Secondary | ICD-10-CM

## 2022-08-10 DIAGNOSIS — G9332 Myalgic encephalomyelitis/chronic fatigue syndrome: Secondary | ICD-10-CM

## 2022-08-10 DIAGNOSIS — Z0000 Encounter for general adult medical examination without abnormal findings: Secondary | ICD-10-CM

## 2022-08-10 DIAGNOSIS — K449 Diaphragmatic hernia without obstruction or gangrene: Secondary | ICD-10-CM

## 2022-08-10 LAB — GAMMA GT: GGT: 29 U/L (ref 7–51)

## 2022-08-10 MED ORDER — MAGNESIUM 500 MG PO CAPS: 1.0000 | ORAL_CAPSULE | ORAL | Status: DC

## 2022-08-10 MED ORDER — PROPRANOLOL HCL 10 MG PO TABS: 10.0000 mg | ORAL_TABLET | Freq: Two times a day (BID) | ORAL | 4 refills | Status: DC

## 2022-08-10 NOTE — Assessment & Plan Note (Signed)
Chronic, stable off medication. Reviewed diet choices to help control cholesterol levels. The 10-year ASCVD risk score (Arnett DK, et al., 2019) is: 15%   Values used to calculate the score:     Age: 67 years     Sex: Male     Is Non-Hispanic African American: No     Diabetic: No     Tobacco smoker: No     Systolic Blood Pressure: 123XX123 mmHg     Is BP treated: Yes     HDL Cholesterol: 42.4 mg/dL     Total Cholesterol: 205 mg/dL

## 2022-08-10 NOTE — Assessment & Plan Note (Signed)
Stable period on prednisone 10mg  PRN.  Stable period off hctz for the past 2 weeks

## 2022-08-10 NOTE — Progress Notes (Signed)
Patient ID: Christopher Contreras, male    DOB: 31-Jan-1956, 67 y.o.   MRN: [id number]  This visit was conducted in person.  BP 126/70   Pulse 71   Temp (!) 97.2 F (36.2 C) (Temporal)   Ht 5' 6.75" (1.695 m)   Wt 203 lb 2 oz (92.1 kg)   SpO2 98%   BMI 32.05 kg/m    CC: CPE Subjective:   HPI: Christopher Contreras is a 67 y.o. male presenting on 08/10/2022 for Annual Exam   Planning to retire at end of year.   Atypical chest pain - had reassuring cardiac workup. Overall improved after increasing omeprazole to 40mg  daily + carafate QID AC HS PRN. He noted carafate significantly improved symptoms.   Sees ENT Dr Pryor Ochoa for meniere's and vestibular dysfunction of left ear  - takes daily hctz 12.5mg  daily with prednisone PRN flare. He held hctz for the past 2 weeks with improvement in dizziness. Seldom tinnitus and hearing loss.   He continues propranolol 10mg  BID for easy tachycardia with exertion post-COVID. Notes ongoing exercise intolerance.   Ongoing difficulty with sleep - sleep initiation and maintenance insomnia. Melatonin hasn't helped.  Reviewed bedtime routine, handout provided.   Preventative: COLONOSCOPY 02/2020 - 1 TA, rpt 5 yrs ([facility]) Prostate cancer s/p radical prostatectomy 08/2018 Alinda Money) sees Qyearly  Lung cancer screening - not eligible  Flu shot - declines COVID shot - [place] 11/2019, 12/2019, booster ~04/2020 Td 1999, 2011, Tdap 01/2022 Pneumonia shot - to consider  Shingrix - 01/2022, 06/2022 Advanced directive discussion -  Seat belt use discussed Sunscreen use discussed. No changing moles on skin. Sees derm s/p Mohs 01/2020 to right ear  Non smoker  Alcohol - none Dentist - q6 mo Eye exam - yearly    Married; lives with wife. 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 Interim medical history since our last visit reviewed. Allergies and medications reviewed and updated. Outpatient Medications Prior to Visit  Medication Sig Dispense Refill   meclizine (ANTIVERT) 25 MG tablet Take 12.5 mg by mouth 3 (three) times daily as needed for dizziness.     MISC NATURAL PRODUCTS PO Take by mouth daily. Verve vitamin supplement drink     omeprazole (PRILOSEC) 40 MG capsule Take 1 capsule (40 mg total) by mouth daily. For 3 weeks then as needed, take 30 min before lunch 30 capsule 3   predniSONE (DELTASONE) 10 MG tablet Take 10 mg by mouth as needed. Patient takes once a week as needed for Meniere's disease     sucralfate (CARAFATE) 1 g tablet Take 1 tablet (1 g total) by mouth 4 (four) times daily -  with meals and at bedtime. 60 tablet 1   Ascorbic Acid (VITAMIN C PO) Take by mouth.     B Complex Vitamins (VITAMIN B COMPLEX PO) Take by mouth.     hydrochlorothiazide (HYDRODIURIL) 12.5 MG tablet Take 12.5 mg by mouth daily.     MAGNESIUM PO Take by mouth.     propranolol (INDERAL) 20 MG tablet Take 10 mg by mouth 2 (two) times daily. 20 mg (1 tablet) in the morning, 10 mg (0.5 tablet) in the evening     cetirizine (ZYRTEC) 10 MG tablet Take 1 tablet (10 mg total)  by mouth daily. 30 tablet 11   COENZYME Q10 PO Take by mouth.     MISC NATURAL PRODUCTS PO Take by mouth. [place]ase     MISC NATURAL PRODUCTS PO Take by mouth. Histamine digest     MISC NATURAL PRODUCTS PO Take by mouth. Flora Udo's oil 3-6-9     Vitamin D-Vitamin K (VITAMIN K2-VITAMIN D3 PO) Take by mouth.     No facility-administered medications prior to visit.     Per HPI unless specifically indicated in ROS section below Review of Systems  Constitutional:  Negative for activity change, appetite change, chills, fatigue, fever and unexpected weight change.  HENT:  Negative for hearing loss.   Eyes:  Negative for visual disturbance.  Respiratory:   Positive for chest tightness (improved). Negative for cough, shortness of breath and wheezing.   Cardiovascular:  Positive for palpitations (exertional). Negative for chest pain and leg swelling.  Gastrointestinal:  Positive for abdominal pain (occ). Negative for abdominal distention, blood in stool, constipation, diarrhea, nausea and vomiting.  Genitourinary:  Negative for difficulty urinating and hematuria.  Musculoskeletal:  Negative for arthralgias, myalgias and neck pain.  Skin:  Negative for rash.  Neurological:  Positive for dizziness (not recently) and headaches (occ frontal). Negative for seizures and syncope.  Hematological:  Negative for adenopathy. Does not bruise/bleed easily.  Psychiatric/Behavioral:  Negative for dysphoric mood. The patient is not nervous/anxious.     Objective:  BP 126/70   Pulse 71   Temp (!) 97.2 F (36.2 C) (Temporal)   Ht 5' 6.75" (1.695 m)   Wt 203 lb 2 oz (92.1 kg)   SpO2 98%   BMI 32.05 kg/m   Wt Readings from Last 3 Encounters:  08/10/22 203 lb 2 oz (92.1 kg)  07/16/22 201 lb 3.2 oz (91.3 kg)  06/29/22 201 lb 2 oz (91.2 kg)      Physical Exam Vitals and nursing note reviewed.  Constitutional:      General: He is not in acute distress.    Appearance: Normal appearance. He is well-developed. He is not ill-appearing.  HENT:     Head: Normocephalic and atraumatic.     Right Ear: Hearing, tympanic membrane, ear canal and external ear normal.     Left Ear: Hearing, tympanic membrane, ear canal and external ear normal.     Mouth/Throat:     Comments: Wearing mask Eyes:     General: No scleral icterus.    Extraocular Movements: Extraocular movements intact.     Conjunctiva/sclera: Conjunctivae normal.     Pupils: Pupils are equal, round, and reactive to light.  Neck:     Thyroid: No thyroid mass or thyromegaly.  Cardiovascular:     Rate and Rhythm: Normal rate and regular rhythm.     Pulses: Normal pulses.          Radial pulses are 2+  on the right side and 2+ on the left side.     Heart sounds: Normal heart sounds. No murmur heard. Pulmonary:     Effort: Pulmonary effort is normal. No respiratory distress.     Breath sounds: Normal breath sounds. No wheezing, rhonchi or rales.  Abdominal:     General: Bowel sounds are normal. There is no distension.     Palpations: Abdomen is soft. There is no mass.     Tenderness: There is no abdominal tenderness. There is no guarding or rebound.     Hernia: No hernia is present.  Musculoskeletal:  General: Normal range of motion.     Cervical back: Normal range of motion and neck supple.     Right lower leg: No edema.     Left lower leg: No edema.  Lymphadenopathy:     Cervical: No cervical adenopathy.  Skin:    General: Skin is warm and dry.     Findings: No rash.  Neurological:     General: No focal deficit present.     Mental Status: He is alert and oriented to person, place, and time.  Psychiatric:        Mood and Affect: Mood normal.        Behavior: Behavior normal.        Thought Content: Thought content normal.        Judgment: Judgment normal.       Results for orders placed or performed in visit on 08/03/22  Hepatitis C antibody  Result Value Ref Range   Hepatitis C Ab NON-REACTIVE NON-REACTIVE  PSA  Result Value Ref Range   PSA 0.00 Repeated and verified X2. (L) 0.10 - 4.00 ng/mL  Comprehensive metabolic panel  Result Value Ref Range   Sodium 140 135 - 145 mEq/L   Potassium 3.9 3.5 - 5.1 mEq/L   Chloride 101 96 - 112 mEq/L   CO2 32 19 - 32 mEq/L   Glucose, Bld 91 70 - 99 mg/dL   BUN 9 6 - 23 mg/dL   Creatinine, Ser 1.27 0.40 - 1.50 mg/dL   Total Bilirubin 2.8 (H) 0.2 - 1.2 mg/dL   Alkaline Phosphatase 54 39 - 117 U/L   AST 21 0 - 37 U/L   ALT 23 0 - 53 U/L   Total Protein 6.9 6.0 - 8.3 g/dL   Albumin 4.2 3.5 - 5.2 g/dL   GFR 59.79 (L) >60.00 mL/min   Calcium 9.4 8.4 - 10.5 mg/dL  Lipid panel  Result Value Ref Range   Cholesterol 205 (H) 0  - 200 mg/dL   Triglycerides 136.0 0.0 - 149.0 mg/dL   HDL 42.40 >39.00 mg/dL   VLDL 27.2 0.0 - 40.0 mg/dL   LDL Cholesterol 135 (H) 0 - 99 mg/dL   Total CHOL/HDL Ratio 5    NonHDL 162.46     Assessment & Plan:   Problem List Items Addressed This Visit     Health maintenance examination - Primary (Chronic)    Preventative protocols reviewed and updated unless pt declined. Discussed healthy diet and lifestyle.       HLD (hyperlipidemia)    Chronic, stable off medication. Reviewed diet choices to help control cholesterol levels. The 10-year ASCVD risk score (Arnett DK, et al., 2019) is: 15%   Values used to calculate the score:     Age: 50 years     Sex: Male     Is Non-Hispanic African American: No     Diabetic: No     Tobacco smoker: No     Systolic Blood Pressure: 123XX123 mmHg     Is BP treated: Yes     HDL Cholesterol: 42.4 mg/dL     Total Cholesterol: 205 mg/dL       Relevant Medications   propranolol (INDERAL) 10 MG tablet   Gilbert syndrome    H/o this - however Tbili trending up over the years. See below.       History of prostate cancer    S/p radical prostatectomy 08/2018, continues seeing uro Dr Alinda Money yearly.       Hiatal  hernia with GERD    Normally managed with omeprazole 20mg  daily      Vestibular dysfunction of left ear    Chronic presenting with intermittent dizziness which has actually improved since holding hctz.       COVID-19 long hauler manifesting chronic fatigue    Overall stable period, persistent exercise intolerance and post-exertional tachycardia managed with propranolol 10mg  BID - I will refill this today.       Meniere disease, left    Stable period on prednisone 10mg  PRN.  Stable period off hctz for the past 2 weeks       Insomnia    Both initiation and maintenance issues. Sleep hygiene measures reviewed. Discussed melatonin use.       Hyperbilirubinemia    Trending up over the past 10 yrs.  Carries dx Gilbert's syndrome. Will  update fractionated bilirubin and GGT.  CBC normal 04/2022.       Relevant Orders   Bilirubin,Direct/Indirect(Fractionated)   Gamma GT   Bilirubin, fractionated(tot/dir/indir)     Meds ordered this encounter  Medications   Magnesium 500 MG CAPS    Sig: Take 1 capsule (500 mg total) by mouth 2 (two) times a week.   propranolol (INDERAL) 10 MG tablet    Sig: Take 1 tablet (10 mg total) by mouth 2 (two) times daily.    Dispense:  180 tablet    Refill:  4    Post exertional tachycardia post-COVID    Orders Placed This Encounter  Procedures   Bilirubin,Direct/Indirect(Fractionated)   Gamma GT   Bilirubin, fractionated(tot/dir/indir)    Patient Instructions  Labs today to recheck bilirubin.  You could look into [employer] or mediterranean diet as healthier options. You are doing well today. Return as needed or in 1 year for a Welcome to Medicare visit.   Bedtime routine checklist: 1. Avoid naps during the day 2. Avoid stimulants such as caffeine and nicotine. Avoid bedtime alcohol (it can speed onset of sleep but the body's metabolism can cause awakenings). 3. All forms of exercise help ensure sound sleep - limit vigorous exercise to morning or late afternoon 4. Avoid food too close to bedtime including chocolate (which contains caffeine) 5. Soak up natural light 6. Establish regular bedtime routine. 7. Associate bed with sleep - avoid TV, computer or phone, reading while in bed. 8. Ensure pleasant, relaxing sleep environment - quiet, dark, cool room.     Follow up plan: Return in about 1 year (around 08/10/2023), or if symptoms worsen or fail to improve, for welcome ot medicare.  Ria Bush, MD

## 2022-08-10 NOTE — Assessment & Plan Note (Addendum)
Both initiation and maintenance issues. Sleep hygiene measures reviewed. Discussed melatonin use.

## 2022-08-10 NOTE — Assessment & Plan Note (Addendum)
Overall stable period, persistent exercise intolerance and post-exertional tachycardia managed with propranolol 10mg  BID - I will refill this today.

## 2022-08-10 NOTE — Assessment & Plan Note (Signed)
Preventative protocols reviewed and updated unless pt declined. Discussed healthy diet and lifestyle.  

## 2022-08-10 NOTE — Assessment & Plan Note (Signed)
H/o this - however Tbili trending up over the years. See below.

## 2022-08-10 NOTE — Assessment & Plan Note (Signed)
S/p radical prostatectomy 08/2018, continues seeing uro Dr Alinda Money yearly.

## 2022-08-10 NOTE — Assessment & Plan Note (Signed)
Chronic presenting with intermittent dizziness which has actually improved since holding hctz.

## 2022-08-10 NOTE — Assessment & Plan Note (Signed)
Trending up over the past 10 yrs.  Carries dx Gilbert's syndrome. Will update fractionated bilirubin and GGT.  CBC normal 04/2022.

## 2022-08-10 NOTE — Patient Instructions (Addendum)
Labs today to recheck bilirubin.  You could look into [employer] or mediterranean diet as healthier options. You are doing well today. Return as needed or in 1 year for a Welcome to Medicare visit.   Bedtime routine checklist: 1. Avoid naps during the day 2. Avoid stimulants such as caffeine and nicotine. Avoid bedtime alcohol (it can speed onset of sleep but the body's metabolism can cause awakenings). 3. All forms of exercise help ensure sound sleep - limit vigorous exercise to morning or late afternoon 4. Avoid food too close to bedtime including chocolate (which contains caffeine) 5. Soak up natural light 6. Establish regular bedtime routine. 7. Associate bed with sleep - avoid TV, computer or phone, reading while in bed. 8. Ensure pleasant, relaxing sleep environment - quiet, dark, cool room.

## 2022-08-10 NOTE — Assessment & Plan Note (Addendum)
Normally managed with omeprazole 20mg  daily

## 2022-08-11 LAB — BILIRUBIN, FRACTIONATED(TOT/DIR/INDIR)
Bilirubin, Direct: 0.3 mg/dL — ABNORMAL HIGH (ref 0.0–0.2)
Indirect Bilirubin: 1.7 mg/dL (calc) — ABNORMAL HIGH (ref 0.2–1.2)
Total Bilirubin: 2 mg/dL — ABNORMAL HIGH (ref 0.2–1.2)

## 2022-09-02 ENCOUNTER — Other Ambulatory Visit: Payer: Self-pay | Admitting: Family Medicine

## 2022-09-03 NOTE — Telephone Encounter (Signed)
Refill request Carafate Last refill 07/31/22 #60/1 Last office visit 08/10/22

## 2022-09-21 ENCOUNTER — Ambulatory Visit (INDEPENDENT_AMBULATORY_CARE_PROVIDER_SITE_OTHER): Payer: BC Managed Care – PPO | Admitting: Family Medicine

## 2022-09-21 ENCOUNTER — Encounter: Payer: Self-pay | Admitting: Family Medicine

## 2022-09-21 ENCOUNTER — Ambulatory Visit (INDEPENDENT_AMBULATORY_CARE_PROVIDER_SITE_OTHER)
Admission: RE | Admit: 2022-09-21 | Discharge: 2022-09-21 | Disposition: A | Discharge status: Home / Self Care | Payer: BC Managed Care – PPO | Source: Ambulatory Visit | Attending: Family Medicine | Admitting: Family Medicine

## 2022-09-21 VITALS — BP 122/70 | HR 88 | Temp 98.0°F | Ht 66.75 in | Wt 203.2 lb

## 2022-09-21 DIAGNOSIS — J209 Acute bronchitis, unspecified: Secondary | ICD-10-CM

## 2022-09-21 DIAGNOSIS — R051 Acute cough: Secondary | ICD-10-CM

## 2022-09-21 DIAGNOSIS — J189 Pneumonia, unspecified organism: Secondary | ICD-10-CM | POA: Diagnosis not present

## 2022-09-21 DIAGNOSIS — R062 Wheezing: Secondary | ICD-10-CM | POA: Diagnosis not present

## 2022-09-21 DIAGNOSIS — R059 Cough, unspecified: Secondary | ICD-10-CM | POA: Diagnosis not present

## 2022-09-21 LAB — POC COVID19 BINAXNOW: SARS Coronavirus 2 Ag: NEGATIVE

## 2022-09-21 MED ORDER — HYDROCODONE BIT-HOMATROP MBR 5-1.5 MG/5ML PO SOLN: 5.0000 mL | Freq: Three times a day (TID) | ORAL | 0 refills | Status: DC | PRN

## 2022-09-21 NOTE — Assessment & Plan Note (Addendum)
Suspect viral  In setting of exp to pneumonia (wife) Covid test negative Cxr ordered- this was normal /reassuring   No wheezing on exam- will watch for  Hycodan for cough (used before) with caution of sedation and habit  See AVS for rec/symptomatic care   ER precautions noted Update if not starting to improve in a week or if worsening

## 2022-09-21 NOTE — Patient Instructions (Addendum)
Try the hycodan for cough  Use with caution of sedation and habit   If you feel like you have chest congestion- mucinex (plain) may help that   Chest xray today   Drink lots of fluids  Rest when you can   If you start wheezing more or get tight breathing let us know  If severe - go to the ER   Update if not starting to improve in a week or if worsening

## 2022-09-21 NOTE — Progress Notes (Unsigned)
Subjective:    Patient ID: Christopher Contreras, male    DOB: Oct 07, 1955, 67 y.o.   MRN: [id number]  HPI 67 yo pf ot Dr Reece Agar presents with cough   Wt Readings from Last 3 Encounters:  09/21/22 203 lb 4 oz (92.2 kg)  08/10/22 203 lb 2 oz (92.1 kg)  07/16/22 201 lb 3.2 oz (91.3 kg)   32.07 kg/m  Vitals:   09/21/22 0758  BP: 122/70  Pulse: 88  Temp: 98 F (36.7 C)  SpO2: 97%   His wife is home with pneumonia (in setting of lyme disease)  She is getting better   2 d ago he started coughing  Keeping him from sleeping  No fever  02 level has been fine  If aching from the cough   Feels some chest congestion  Nothing is coming out yet  Heard a little wheezing  No sob   A little headache -worse with cough  Some tingling in r ear this am  No nasal symptoms Throat is ok  Just a little hoarse   Otc:  Took some of his wife's cough med (with narcotic)   He takes prednisone for meniere's about once per month      Results for orders placed or performed in visit on 09/21/22  POC COVID-19  Result Value Ref Range   SARS Coronavirus 2 Ag Negative Negative    Patient Active Problem List   Diagnosis Date Noted   Acute bronchitis 09/21/2022   Insomnia 08/10/2022   Hyperbilirubinemia 08/10/2022   Meniere disease, left 03/16/2022   Ocular migraine 01/26/2022   Benign paroxysmal positional vertigo 01/26/2022   Health maintenance examination 08/08/2021   COVID-19 long hauler manifesting chronic fatigue 07/04/2021   Atypical chest pain 07/04/2021   DOE (dyspnea on exertion) 08/16/2019   Hiatal hernia with GERD 08/16/2019   Vestibular dysfunction of left ear 08/16/2019   History of prostate cancer 07/09/2018   Erectile dysfunction 02/13/2017   BPH (benign prostatic hyperplasia) 11/12/2016   Fatigue 07/20/2016   Abdominal discomfort, epigastric 07/20/2016   Anxiety state 12/27/2008   HLD (hyperlipidemia) 03/17/2008   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 helix. Superficial and nodular.    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   Heartburn 08/16/2019   Hepatitis A was in 9th grade   HLD (hyperlipidemia) 2/08   Personal history of other infectious and parasitic disease    reports this as a hepatitis A infection in junior HS , denies any liver disorder    Prostate cancer (HCC)    Pseudocholinesterase deficiency    patient reports this runs in his family ; but is  unsure if he has the trait; see anethesia history for family history    Shortness of breath 08/16/2019   Vestibular disorder, left    ear ; injury occurred in the military    Vestibular dysfunction of left ear 08/16/2019   Residual 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 Marina Goodell   COLONOSCOPY  02/2020   1 TA, rpt 5 yrs ([facility])   ESOPHAGOGASTRODUODENOSCOPY  02/2020   4cm HH noninflamed, normal EGD otherwise ([facility])   LYMPHADENECTOMY Bilateral 09/15/2018   Procedure: LYMPHADENECTOMY,  PELVIC;  Surgeon: Heloise Purpura, MD;  Location: [facility];  Service: Urology;  Laterality: Bilateral;   ROBOT ASSISTED LAPAROSCOPIC RADICAL PROSTATECTOMY N/A 09/15/2018   Procedure: XI ROBOTIC ASSISTED LAPAROSCOPIC RADICAL PROSTATECTOMY LEVEL 2;  Laverle Patter, Konrad Dolores, MD)   VASECTOMY  pre 1999   Dr. Artis Flock   Social History   Tobacco Use   Smoking status: Never   Smokeless tobacco: Never  Vaping Use   Vaping Use: Never used  Substance Use Topics   Alcohol use: Not Currently    Comment: Rare   Drug use: No   Family History  Problem Relation Age of Onset   Prostate cancer Father        s/p prostatectomy   Other Father        Back pain with neuropathy   Heart failure Father    Colon cancer Mother 69   Colon polyps Sister    Colon polyps Sister    Heart attack Other        Maternal side   Hypertension Other        Both sides   Diabetes Son    Diabetes Paternal Grandfather    Bone cancer Maternal Grandmother    Heart failure Maternal Grandfather    Allergies  Allergen Reactions   Sulfonamide Derivatives     Unknown, childhood allergy   Current Outpatient Medications on File Prior to Visit  Medication Sig Dispense Refill   Magnesium 500 MG CAPS Take 1 capsule (500 mg total) by mouth 2 (two) times a week.     meclizine (ANTIVERT) 25 MG tablet Take 12.5 mg by mouth 3 (three) times daily as needed for dizziness.     MISC NATURAL PRODUCTS PO Take by mouth daily. Verve vitamin supplement drink     omeprazole (PRILOSEC) 40 MG capsule Take 1 capsule (40 mg total) by mouth daily. For 3 weeks then as needed, take 30 min before lunch 30 capsule 3   predniSONE (DELTASONE) 10 MG tablet Take 10 mg by mouth as needed. Patient takes once a week as needed for Meniere's disease     propranolol (INDERAL) 10 MG tablet Take 1 tablet (10 mg total) by mouth 2 (two) times daily. 180 tablet 4   sucralfate (CARAFATE) 1 g tablet Take 1 tablet (1 g total) by mouth 3 (three) times daily as needed (abd pain).  30 tablet 0   No current facility-administered medications on file prior to visit.    Review of Systems  Constitutional:  Positive for fatigue. Negative for activity change, appetite change, fever and unexpected weight change.  HENT:  Negative for congestion, rhinorrhea, sore throat and trouble swallowing.   Eyes:  Negative  for pain, redness, itching and visual disturbance.  Respiratory:  Positive for cough and wheezing. Negative for chest tightness, shortness of breath and stridor.   Cardiovascular:  Negative for chest pain and palpitations.  Gastrointestinal:  Negative for abdominal pain, blood in stool, constipation, diarrhea and nausea.  Endocrine: Negative for cold intolerance, heat intolerance, polydipsia and polyuria.  Genitourinary:  Negative for difficulty urinating, dysuria, frequency and urgency.  Musculoskeletal:  Negative for arthralgias, joint swelling and myalgias.  Skin:  Negative for pallor and rash.  Neurological:  Positive for headaches. Negative for dizziness, tremors, weakness and numbness.  Hematological:  Negative for adenopathy. Does not bruise/bleed easily.  Psychiatric/Behavioral:  Negative for decreased concentration and dysphoric mood. The patient is not nervous/anxious.        Objective:   Physical Exam Constitutional:      General: He is not in acute distress.    Appearance: Normal appearance. He is well-developed. He is obese. He is not ill-appearing or diaphoretic.  HENT:     Head: Normocephalic and atraumatic.     Right Ear: Tympanic membrane and ear canal normal.     Left Ear: Tympanic membrane and ear canal normal.     Nose:     Comments: Mild clear pnd    Mouth/Throat:     Pharynx: Oropharynx is clear. No oropharyngeal exudate or posterior oropharyngeal erythema.     Comments: Mildly hoarse voice  Eyes:     General:        Right eye: No discharge.        Left eye: No discharge.     Conjunctiva/sclera: Conjunctivae normal.     Pupils: Pupils  are equal, round, and reactive to light.  Neck:     Thyroid: No thyromegaly.     Vascular: No carotid bruit or JVD.  Cardiovascular:     Rate and Rhythm: Normal rate and regular rhythm.     Heart sounds: Normal heart sounds.     No gallop.  Pulmonary:     Effort: Pulmonary effort is normal. No respiratory distress.     Breath sounds: Normal breath sounds. No stridor. No wheezing, rhonchi or rales.     Comments: Good air exch No wheeze even on forced exp Cough sounds barky Chest:     Chest wall: No tenderness.  Abdominal:     General: There is no distension or abdominal bruit.     Palpations: Abdomen is soft.  Musculoskeletal:     Cervical back: Normal range of motion and neck supple. No tenderness.     Right lower leg: No edema.     Left lower leg: No edema.  Lymphadenopathy:     Cervical: No cervical adenopathy.  Skin:    General: Skin is warm and dry.     Coloration: Skin is not pale.     Findings: No rash.  Neurological:     Mental Status: He is alert.     Coordination: Coordination normal.     Deep Tendon Reflexes: Reflexes are normal and symmetric. Reflexes normal.  Psychiatric:        Mood and Affect: Mood normal.           Assessment & Plan:

## 2022-09-27 ENCOUNTER — Encounter: Payer: Self-pay | Admitting: Primary Care

## 2022-09-27 ENCOUNTER — Ambulatory Visit (INDEPENDENT_AMBULATORY_CARE_PROVIDER_SITE_OTHER): Payer: BC Managed Care – PPO | Admitting: Primary Care

## 2022-09-27 VITALS — BP 118/76 | HR 79 | Temp 98.6°F | Ht 66.75 in | Wt 201.0 lb

## 2022-09-27 DIAGNOSIS — J209 Acute bronchitis, unspecified: Secondary | ICD-10-CM | POA: Diagnosis not present

## 2022-09-27 MED ORDER — AMOXICILLIN-POT CLAVULANATE 875-125 MG PO TABS: 1.0000 | ORAL_TABLET | Freq: Two times a day (BID) | ORAL | 0 refills | Status: DC

## 2022-09-27 MED ORDER — BENZONATATE 200 MG PO CAPS: 200.0000 mg | ORAL_CAPSULE | Freq: Three times a day (TID) | ORAL | 0 refills | Status: DC | PRN

## 2022-09-27 NOTE — Patient Instructions (Signed)
Start Augmentin antibiotics. Take 1 tablet by mouth twice daily for 7 days.  You may take Benzonatate capsules for cough. Take 1 capsule by mouth three times daily as needed for cough.  It was a pleasure meeting you!

## 2022-09-27 NOTE — Progress Notes (Signed)
Subjective:    Patient ID: Christopher Contreras, male    DOB: May 13, 1956, 67 y.o.   MRN: [id number]  Cough Associated symptoms include myalgias. Pertinent negatives include no chest pain, chills, fever, headaches or sore throat.    Christopher Contreras is a very pleasant 67 y.o. male patient of Dr. Sharen Hones with a history of bronchitis, meniere's disease, GERD, Covid-19 long hauler who presents today to discuss cough.  Evaluated by Dr. Milinda Antis on 09/21/22 for a 2 day history of cough, chest congestion, wheezing, headaches. Had been exposed to his wife who had pneumonia. He was diagnosed with viral bronchitis, underwent chest xray and Covid-19 testing which were negative. He was prescribed Hycodan cough syrup and return precautions.  Today he continues to experience a "violent" cough and feels no better. Also with body aches from cough, fatigue, chest congestion. He denies fevers, chills. He's been going to work per usual but by around lunch time he's feeling more fatigued.   He has been taking Mucinex and Hycodan, and his typical medications. He's compliant to his omeprazole and Carafate.     Review of Systems  Constitutional:  Positive for fatigue. Negative for chills and fever.  HENT:  Positive for congestion. Negative for sinus pressure and sore throat.   Respiratory:  Positive for cough.   Cardiovascular:  Negative for chest pain.  Musculoskeletal:  Positive for myalgias.  Neurological:  Negative for headaches.         Past Medical History:  Diagnosis Date   Abdominal discomfort, epigastric 07/20/2016   Anxiety    ANXIETY 12/27/2008   Qualifier: Diagnosis of  By: Hetty Ely MD, Franne Grip    Basal cell carcinoma 10/07/2019   Right sup. helix. Superficial and nodular.    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   Heartburn 08/16/2019   Hepatitis A was in 9th grade   HLD (hyperlipidemia) 2/08   Personal history of other infectious and parasitic disease    reports this as a hepatitis A infection in junior HS , denies any liver disorder    Prostate cancer (HCC)    Pseudocholinesterase deficiency    patient reports this runs in his family ; but is unsure if he has the trait; see anethesia history for family history    Shortness of breath 08/16/2019   Vestibular disorder, left    ear ; injury occurred in the military    Vestibular dysfunction of left ear 08/16/2019   Residual since military service 1980s    Social History   Socioeconomic History   Marital status: Married    Spouse name: Not on file   Number of children: 3   Years of education: Not on file   Highest education level: Bachelor's degree (e.g., BA, AB, BS)  Occupational History   Occupation: Works as an Print production planner  Tobacco Use   Smoking status: Never   Smokeless tobacco: Never  Vaping Use   Vaping Use: Never  used  Substance and Sexual Activity   Alcohol use: Not Currently    Comment: Rare   Drug use: No   Sexual activity: Yes  Other Topics Concern   Not on file  Social History Narrative   Married; lives with wife. 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   Social Determinants of Health   Financial Resource Strain: Low Risk  (09/20/2022)   Overall Financial Resource Strain (CARDIA)    Difficulty of Paying Living Expenses: Not hard at all  Food Insecurity: No Food Insecurity (09/20/2022)   Hunger Vital Sign    Worried About Running Out of Food in the Last Year: Never true    Ran Out  of Food in the Last Year: Never true  Transportation Needs: No Transportation Needs (09/20/2022)   PRAPARE - Administrator, Civil Service (Medical): No    Lack of Transportation (Non-Medical): No  Physical Activity: Insufficiently Active (09/20/2022)   Exercise Vital Sign    Days of Exercise per Week: 4 days    Minutes of Exercise per Session: 30 min  Stress: No Stress Concern Present (09/20/2022)   [employer] of Occupational Health - Occupational Stress Questionnaire    Feeling of Stress : Not at all  Social Connections: Socially Integrated (09/20/2022)   Social Connection and Isolation Panel [NHANES]    Frequency of Communication with Friends and Family: More than three times a week    Frequency of Social Gatherings with Friends and Family: Once a week    Attends Religious Services: More than 4 times per year    Active Member of [employer] or Organizations: Yes    Attends Engineer, structural: More than 4 times per year    Marital Status: Married  Catering manager Violence: Not on file    Past Surgical History:  Procedure Laterality Date   BASAL CELL CARCINOMA EXCISION  1/99   right face   CATARACT EXTRACTION, BILATERAL  about 2-4 years ago   with bilateral lens placement   COLONOSCOPY  03/29/01   Diverticulosis   COLONOSCOPY  02/15/10   Divertics-Dr. Marina Goodell   COLONOSCOPY  02/2020   1 TA, rpt 5 yrs ([facility])   ESOPHAGOGASTRODUODENOSCOPY  02/2020   4cm HH noninflamed, normal EGD otherwise ([facility])   LYMPHADENECTOMY Bilateral 09/15/2018   Procedure: LYMPHADENECTOMY, PELVIC;  Surgeon: Heloise Purpura, MD;  Location: [facility];  Service: Urology;  Laterality: Bilateral;   ROBOT ASSISTED LAPAROSCOPIC RADICAL PROSTATECTOMY N/A 09/15/2018   Procedure: XI ROBOTIC ASSISTED LAPAROSCOPIC RADICAL PROSTATECTOMY LEVEL 2;  Heloise Purpura, MD)   VASECTOMY  pre 1999   Dr. Artis Flock    Family History  Problem Relation Age of Onset   Prostate cancer Father        s/p  prostatectomy   Other Father        Back pain with neuropathy   Heart failure Father    Colon cancer Mother 79   Colon polyps Sister    Colon polyps Sister    Heart attack Other        Maternal side   Hypertension Other        Both sides   Diabetes Son    Diabetes Paternal Grandfather    Bone cancer Maternal Grandmother    Heart failure Maternal Grandfather     Allergies  Allergen Reactions   Sulfonamide Derivatives     Unknown, childhood allergy    Current Outpatient Medications on File Prior  to Visit  Medication Sig Dispense Refill   HYDROcodone bit-homatropine (HYCODAN) 5-1.5 MG/5ML syrup Take 5 mLs by mouth every 8 (eight) hours as needed for cough. 120 mL 0   Magnesium 500 MG CAPS Take 1 capsule (500 mg total) by mouth 2 (two) times a week.     meclizine (ANTIVERT) 25 MG tablet Take 12.5 mg by mouth 3 (three) times daily as needed for dizziness.     MISC NATURAL PRODUCTS PO Take by mouth daily. Verve vitamin supplement drink     omeprazole (PRILOSEC) 40 MG capsule Take 1 capsule (40 mg total) by mouth daily. For 3 weeks then as needed, take 30 min before lunch 30 capsule 3   predniSONE (DELTASONE) 10 MG tablet Take 10 mg by mouth as needed. Patient takes once a week as needed for Meniere's disease     propranolol (INDERAL) 10 MG tablet Take 1 tablet (10 mg total) by mouth 2 (two) times daily. 180 tablet 4   sucralfate (CARAFATE) 1 g tablet Take 1 tablet (1 g total) by mouth 3 (three) times daily as needed (abd pain). 30 tablet 0   No current facility-administered medications on file prior to visit.    BP 118/76   Pulse 79   Temp 98.6 F (37 C) (Temporal)   Ht 5' 6.75" (1.695 m)   Wt 201 lb (91.2 kg)   SpO2 97%   BMI 31.72 kg/m  Objective:   Physical Exam Constitutional:      Appearance: He is not ill-appearing.  HENT:     Right Ear: Tympanic membrane and ear canal normal.     Left Ear: Tympanic membrane and ear canal normal.     Nose: No mucosal edema.      Right Sinus: No maxillary sinus tenderness or frontal sinus tenderness.     Left Sinus: No maxillary sinus tenderness or frontal sinus tenderness.     Mouth/Throat:     Mouth: Mucous membranes are moist.  Eyes:     Conjunctiva/sclera: Conjunctivae normal.  Cardiovascular:     Rate and Rhythm: Normal rate and regular rhythm.  Pulmonary:     Effort: Pulmonary effort is normal.     Breath sounds: Normal breath sounds. No wheezing or rales.     Comments: Dry cough noted several times during visit.  Musculoskeletal:     Cervical back: Neck supple.  Skin:    General: Skin is warm and dry.           Assessment & Plan:  Acute bronchitis, unspecified organism Assessment & Plan: Could still be viral, but given no improvement in symptoms, coupled with presentation and wife with recent pneumonia history, will treat. Reviewed chest xray from last week.  Start Augmentin antibiotics. Take 1 tablet by mouth twice daily for 7 days. Start Benzonatate capsules for cough. Take 1 capsule by mouth three times daily as needed for cough.  Continue omeprazole 40 mg daily and Carafate PRN.  Follow up if no improvement.  Orders: -     Amoxicillin-Pot Clavulanate; Take 1 tablet by mouth 2 (two) times daily.  Dispense: 14 tablet; Refill: 0 -     Benzonatate; Take 1 capsule (200 mg total) by mouth 3 (three) times daily as needed for cough.  Dispense: 15 capsule; Refill: 0        Doreene Nest, NP

## 2022-09-27 NOTE — Assessment & Plan Note (Addendum)
Could still be viral, but given no improvement in symptoms, coupled with presentation and wife with recent pneumonia history, will treat. Reviewed chest xray from last week.  Start Augmentin antibiotics. Take 1 tablet by mouth twice daily for 7 days. Start Benzonatate capsules for cough. Take 1 capsule by mouth three times daily as needed for cough.  Continue omeprazole 40 mg daily and Carafate PRN.  Follow up if no improvement.

## 2022-10-11 ENCOUNTER — Telehealth: Payer: Self-pay

## 2022-10-11 NOTE — Telephone Encounter (Signed)
PA initiated via Covermymeds; KEY: [id number]. Awaiting determination.

## 2022-10-15 NOTE — Telephone Encounter (Signed)
Left message on voicemail for patient to call the office back. 

## 2022-10-15 NOTE — Telephone Encounter (Signed)
PA denied. No further information given.  

## 2022-10-15 NOTE — Telephone Encounter (Signed)
Plz notify tessalon perls were denied by insurance.  Is he still having cough?

## 2022-10-16 NOTE — Telephone Encounter (Signed)
Left another message on voicemail for patient to call the office back. 

## 2022-10-17 NOTE — Telephone Encounter (Signed)
Left message to return call to our office.  And sent my chart message to call office.

## 2022-10-17 NOTE — Telephone Encounter (Signed)
Lvm asking pt to call back.  Need to relay Dr. G's message and get answer to his question.  

## 2022-10-19 NOTE — Telephone Encounter (Signed)
Lvm asking pt to call back. Need to relay Dr. Timoteo Expose message and get answer to his question.   We have made several attempts to contact pt [by phn and MyChart] with no response. Closing encounter.

## 2022-10-23 NOTE — Telephone Encounter (Signed)
Patient called back states that he was given something before for the cough that was covered [facility]) he was able to take that at night. He states symptoms have improved and he is feeling much better. No further action needed at this time.

## 2022-10-29 DIAGNOSIS — C61 Malignant neoplasm of prostate: Secondary | ICD-10-CM | POA: Diagnosis not present

## 2022-11-02 DIAGNOSIS — C61 Malignant neoplasm of prostate: Secondary | ICD-10-CM | POA: Diagnosis not present

## 2022-12-03 ENCOUNTER — Other Ambulatory Visit: Payer: BC Managed Care – PPO

## 2022-12-05 ENCOUNTER — Encounter: Payer: Self-pay | Admitting: *Deleted

## 2022-12-05 ENCOUNTER — Ambulatory Visit (INDEPENDENT_AMBULATORY_CARE_PROVIDER_SITE_OTHER): Payer: BC Managed Care – PPO | Admitting: Family Medicine

## 2022-12-05 ENCOUNTER — Encounter: Payer: Self-pay | Admitting: Family Medicine

## 2022-12-05 VITALS — BP 124/80 | HR 65 | Temp 97.4°F | Ht 66.75 in | Wt 202.0 lb

## 2022-12-05 DIAGNOSIS — K449 Diaphragmatic hernia without obstruction or gangrene: Secondary | ICD-10-CM

## 2022-12-05 DIAGNOSIS — U099 Post covid-19 condition, unspecified: Secondary | ICD-10-CM

## 2022-12-05 DIAGNOSIS — K219 Gastro-esophageal reflux disease without esophagitis: Secondary | ICD-10-CM

## 2022-12-05 DIAGNOSIS — G9332 Myalgic encephalomyelitis/chronic fatigue syndrome: Secondary | ICD-10-CM

## 2022-12-05 DIAGNOSIS — R0789 Other chest pain: Secondary | ICD-10-CM | POA: Diagnosis not present

## 2022-12-05 MED ORDER — MAGNESIUM 500 MG PO CAPS: 1.0000 | ORAL_CAPSULE | Freq: Two times a day (BID) | ORAL | Status: AC

## 2022-12-05 MED ORDER — B COMPLEX VITAMINS PO CAPS: 1.0000 | ORAL_CAPSULE | Freq: Every day | ORAL | Status: AC

## 2022-12-05 MED ORDER — OMEPRAZOLE 40 MG PO CPDR: 40.0000 mg | DELAYED_RELEASE_CAPSULE | Freq: Every day | ORAL | 3 refills | Status: DC

## 2022-12-05 MED ORDER — SUCRALFATE 1 G PO TABS: 1.0000 g | ORAL_TABLET | Freq: Three times a day (TID) | ORAL | 0 refills | Status: DC | PRN

## 2022-12-05 NOTE — Assessment & Plan Note (Addendum)
Recurring symptoms despite taking carafate daily + omeprazole 20mg  daily. Doesn't have typical GERD symptoms, presents with atypical chest discomfort as per below.  4cm HH on EGD 2021 ([facility]).  Will increase omeprazole 40mg  daily, reviewed dietary GERD precautions, recommend hold carafate and only take if symptomatic.  Will refer back to GI for consideration of repeat endoscopy.  Pt agrees with plan.

## 2022-12-05 NOTE — Patient Instructions (Addendum)
Increase omeprazole back to 40mg  daily.  Hold carafate, use as needed only for abdominal discomfort.  We will refer you back to GI to consider repeat endoscopy to evaluate hiatal hernia given ongoing symptoms.

## 2022-12-05 NOTE — Assessment & Plan Note (Signed)
Exertional tachycardia post COVID. Stable period on propranolol 10mg  BID - continue this.

## 2022-12-05 NOTE — Assessment & Plan Note (Signed)
Recurring symptoms. Had reassuring cardiac evaluation earlier this month. Refer to GI as per above.

## 2022-12-05 NOTE — Progress Notes (Signed)
Ph: [phone] Fax: [phone]   Patient ID: Christopher Contreras, male    DOB: April 07, 1956, 67 y.o.   MRN: [id number]  This visit was conducted in person.  BP 124/80   Pulse 65   Temp (!) 97.4 F (36.3 C) (Temporal)   Ht 5' 6.75" (1.695 m)   Wt 202 lb (91.6 kg)   SpO2 98%   BMI 31.88 kg/m    CC: GERD /HH f/u  Subjective:   HPI: Christopher Contreras is a 67 y.o. male presenting on 12/05/2022 for Medical Management of Chronic Issues (Here for hiatal hernia f/u and to discuss in need to continue meds for hernia. )   GERD presenting with atypical chest pain managed with omeprazole 40mg  daily, also took carafate course 06/2022 with significant benefit.  ESOPHAGOGASTRODUODENOSCOPY 02/2020 - 4cm HH noninflamed, normal EGD otherwise ([facility]).   Over the past month notes symptoms recurring worse when sitting or laying down. Describes constant dull ache to left lower chest, but generally with chest heaviness/discomfort, occasional mild electrical shock across chest.  Hasn't found food triggers. Doesn't regularly eat spicy foods or significant caffeine.  Does drink Verve zero sugar energy drink (80mg  caffeine) a few times a week and bai drink (35mg  caffeine) .  He continues taking omeprazole 20mg  daily and carafate 1gm daily.  He is down to a few pills of carafate left.   S/p reassuring cardiac evaluation - nuclear stress test and echocardiogram 05/2022 were normal.   Sees ENT Dr Andee Poles for meniere's and vestibular dysfunction of left ear - takes daily hctz 12.5mg  daily with prednisone PRN flare.   Exertional tachycardia post COVID managed with propranolol 10mg  BID PRN.      Relevant past medical, surgical, family and social history reviewed and updated as indicated. Interim medical history since our last visit reviewed. Allergies and medications reviewed and updated. Outpatient Medications Prior to Visit  Medication Sig Dispense Refill   meclizine (ANTIVERT) 25 MG tablet Take 12.5 mg  by mouth 3 (three) times daily as needed for dizziness.     MISC NATURAL PRODUCTS PO Take by mouth daily. Verve vitamin supplement drink     predniSONE (DELTASONE) 10 MG tablet Take 10 mg by mouth as needed. Patient takes once a week as needed for Meniere's disease     propranolol (INDERAL) 10 MG tablet Take 1 tablet (10 mg total) by mouth 2 (two) times daily. 180 tablet 4   Magnesium 500 MG CAPS Take 1 capsule (500 mg total) by mouth 2 (two) times a week.     omeprazole (PRILOSEC) 40 MG capsule Take 1 capsule (40 mg total) by mouth daily. For 3 weeks then as needed, take 30 min before lunch 30 capsule 3   sucralfate (CARAFATE) 1 g tablet Take 1 tablet (1 g total) by mouth 3 (three) times daily as needed (abd pain). 30 tablet 0   amoxicillin-clavulanate (AUGMENTIN) 875-125 MG tablet Take 1 tablet by mouth 2 (two) times daily. 14 tablet 0   benzonatate (TESSALON) 200 MG capsule Take 1 capsule (200 mg total) by mouth 3 (three) times daily as needed for cough. 15 capsule 0   HYDROcodone bit-homatropine (HYCODAN) 5-1.5 MG/5ML syrup Take 5 mLs by mouth every 8 (eight) hours as needed for cough. 120 mL 0   No facility-administered medications prior to visit.     Per HPI unless specifically indicated in ROS section below Review of Systems  Objective:  BP 124/80   Pulse 65   Temp (!)  97.4 F (36.3 C) (Temporal)   Ht 5' 6.75" (1.695 m)   Wt 202 lb (91.6 kg)   SpO2 98%   BMI 31.88 kg/m   Wt Readings from Last 3 Encounters:  12/05/22 202 lb (91.6 kg)  09/27/22 201 lb (91.2 kg)  09/21/22 203 lb 4 oz (92.2 kg)      Physical Exam Vitals and nursing note reviewed.  Constitutional:      Appearance: Normal appearance. He is not ill-appearing.  HENT:     Mouth/Throat:     Mouth: Mucous membranes are moist.     Pharynx: Oropharynx is clear. No oropharyngeal exudate or posterior oropharyngeal erythema.  Eyes:     Extraocular Movements: Extraocular movements intact.     Pupils: Pupils are  equal, round, and reactive to light.  Neck:     Thyroid: No thyroid mass or thyromegaly.  Cardiovascular:     Rate and Rhythm: Normal rate and regular rhythm.     Pulses: Normal pulses.     Heart sounds: Normal heart sounds. No murmur heard. Pulmonary:     Effort: Pulmonary effort is normal. No respiratory distress.     Breath sounds: Normal breath sounds. No wheezing, rhonchi or rales.  Abdominal:     General: Abdomen is flat. Bowel sounds are normal. There is no distension.     Palpations: Abdomen is soft. There is no mass.     Tenderness: There is no abdominal tenderness. There is no guarding or rebound.     Hernia: No hernia is present.  Musculoskeletal:     Cervical back: Normal range of motion and neck supple.     Right lower leg: No edema.     Left lower leg: No edema.  Skin:    General: Skin is warm and dry.     Findings: No rash.  Neurological:     Mental Status: He is alert.  Psychiatric:        Mood and Affect: Mood normal.        Behavior: Behavior normal.       DG Chest 2 View CLINICAL DATA:  Coughing and wheezing for 3 days getting worse, wife has pneumonia  EXAM: CHEST - 2 VIEW  COMPARISON:  05/22/2022  FINDINGS: Normal heart size, mediastinal contours, and pulmonary vascularity.  Lungs clear.  No pleural effusion or pneumothorax.  Bones unremarkable.  IMPRESSION: Normal exam.  Electronically Signed   By: Ulyses Southward M.D.   On: 09/21/2022 08:33  Assessment & Plan:   Problem List Items Addressed This Visit     Hiatal hernia with GERD - Primary    Recurring symptoms despite taking carafate daily + omeprazole 20mg  daily. Doesn't have typical GERD symptoms, presents with atypical chest discomfort as per below.  4cm HH on EGD 2021 ([facility]).  Will increase omeprazole 40mg  daily, reviewed dietary GERD precautions, recommend hold carafate and only take if symptomatic.  Will refer back to GI for consideration of repeat endoscopy.  Pt agrees with  plan.       Relevant Medications   omeprazole (PRILOSEC) 40 MG capsule   sucralfate (CARAFATE) 1 g tablet   Other Relevant Orders   Ambulatory referral to Gastroenterology   COVID-19 long hauler manifesting chronic fatigue    Exertional tachycardia post COVID. Stable period on propranolol 10mg  BID - continue this.       Atypical chest pain    Recurring symptoms. Had reassuring cardiac evaluation earlier this month. Refer to GI as per above.  Relevant Orders   Ambulatory referral to Gastroenterology     Meds ordered this encounter  Medications   omeprazole (PRILOSEC) 40 MG capsule    Sig: Take 1 capsule (40 mg total) by mouth daily.    Dispense:  30 capsule    Refill:  3   Magnesium 500 MG CAPS    Sig: Take 1 capsule (500 mg total) by mouth in the morning and at bedtime.   sucralfate (CARAFATE) 1 g tablet    Sig: Take 1 tablet (1 g total) by mouth 3 (three) times daily as needed (abd pain).    Dispense:  30 tablet    Refill:  0   b complex vitamins capsule    Sig: Take 1 capsule by mouth daily.    Orders Placed This Encounter  Procedures   Ambulatory referral to Gastroenterology    Referral Priority:   Routine    Referral Type:   Consultation    Referral Reason:   Specialty Services Required    Number of Visits Requested:   1    Patient Instructions  Increase omeprazole back to 40mg  daily.  Hold carafate, use as needed only for abdominal discomfort.  We will refer you back to GI to consider repeat endoscopy to evaluate hiatal hernia given ongoing symptoms.   Follow up plan: Return if symptoms worsen or fail to improve.  Eustaquio Boyden, MD

## 2022-12-06 ENCOUNTER — Encounter: Payer: BC Managed Care – PPO | Admitting: Family Medicine

## 2022-12-07 ENCOUNTER — Encounter: Payer: BC Managed Care – PPO | Admitting: Family Medicine

## 2022-12-14 ENCOUNTER — Other Ambulatory Visit: Payer: Self-pay | Admitting: Family Medicine

## 2022-12-14 NOTE — Telephone Encounter (Signed)
Too soon. Rx sent on 12/05/22, #30/0 to [facility].  Request denied.

## 2022-12-19 ENCOUNTER — Encounter: Payer: Self-pay | Admitting: Family Medicine

## 2022-12-19 DIAGNOSIS — R1013 Epigastric pain: Secondary | ICD-10-CM

## 2022-12-19 DIAGNOSIS — R0789 Other chest pain: Secondary | ICD-10-CM

## 2022-12-19 DIAGNOSIS — K449 Diaphragmatic hernia without obstruction or gangrene: Secondary | ICD-10-CM

## 2022-12-19 NOTE — Telephone Encounter (Signed)
[facility] did contact the patient and they were not able to offer a sooner appt than their first available due to the referral being "routine" and not having any urgency.   Patient was offered an appt with [facility] on 04/30/2023 - pt declined.  Per Care Everywhere notes with [facility] Ardeth Sportsman - 12/19/2022 11:30 AM EDT No sooner appointment available at this time unless cancellation. Patient is on cancellation list.  -Ardeth Sportsman

## 2022-12-20 ENCOUNTER — Telehealth: Payer: Self-pay | Admitting: Internal Medicine

## 2022-12-20 NOTE — Telephone Encounter (Signed)
Good Morning Dr. Marina Goodell,   Patient called stating that his PCP Dr. Sharen Hones sent a referral over for him to be seen for Hiatal hernia with GERD, abdominal discomfort and chest pain. After looking at patients chart he has [facility]. Patient seems that he was a previous patient of yours and is requesting a transfer of care back to [place] due to Dr. Kinnie Scales retiring. Patient records show he had a office visit with [facility] in June of 2021 for epigastric pain and then in October of 2021 had a colonoscopy. Patients records are in EPIC. Will you please review and advise on scheduling patient?  Thank you.

## 2022-12-20 NOTE — Telephone Encounter (Signed)
Okay to reestablish with [place]. Just make sure all of his records are in epic.  Thanks

## 2023-01-02 ENCOUNTER — Encounter: Payer: Self-pay | Admitting: Internal Medicine

## 2023-01-16 IMAGING — CR DG CHEST 2V
2 series · 2 of 2 positions shown · non-contrast
Comparison: 08/13/2019

CLINICAL DATA: 65-year-old male with dyspnea on exertion

EXAM:
CHEST - 2 VIEW

[chest pa]
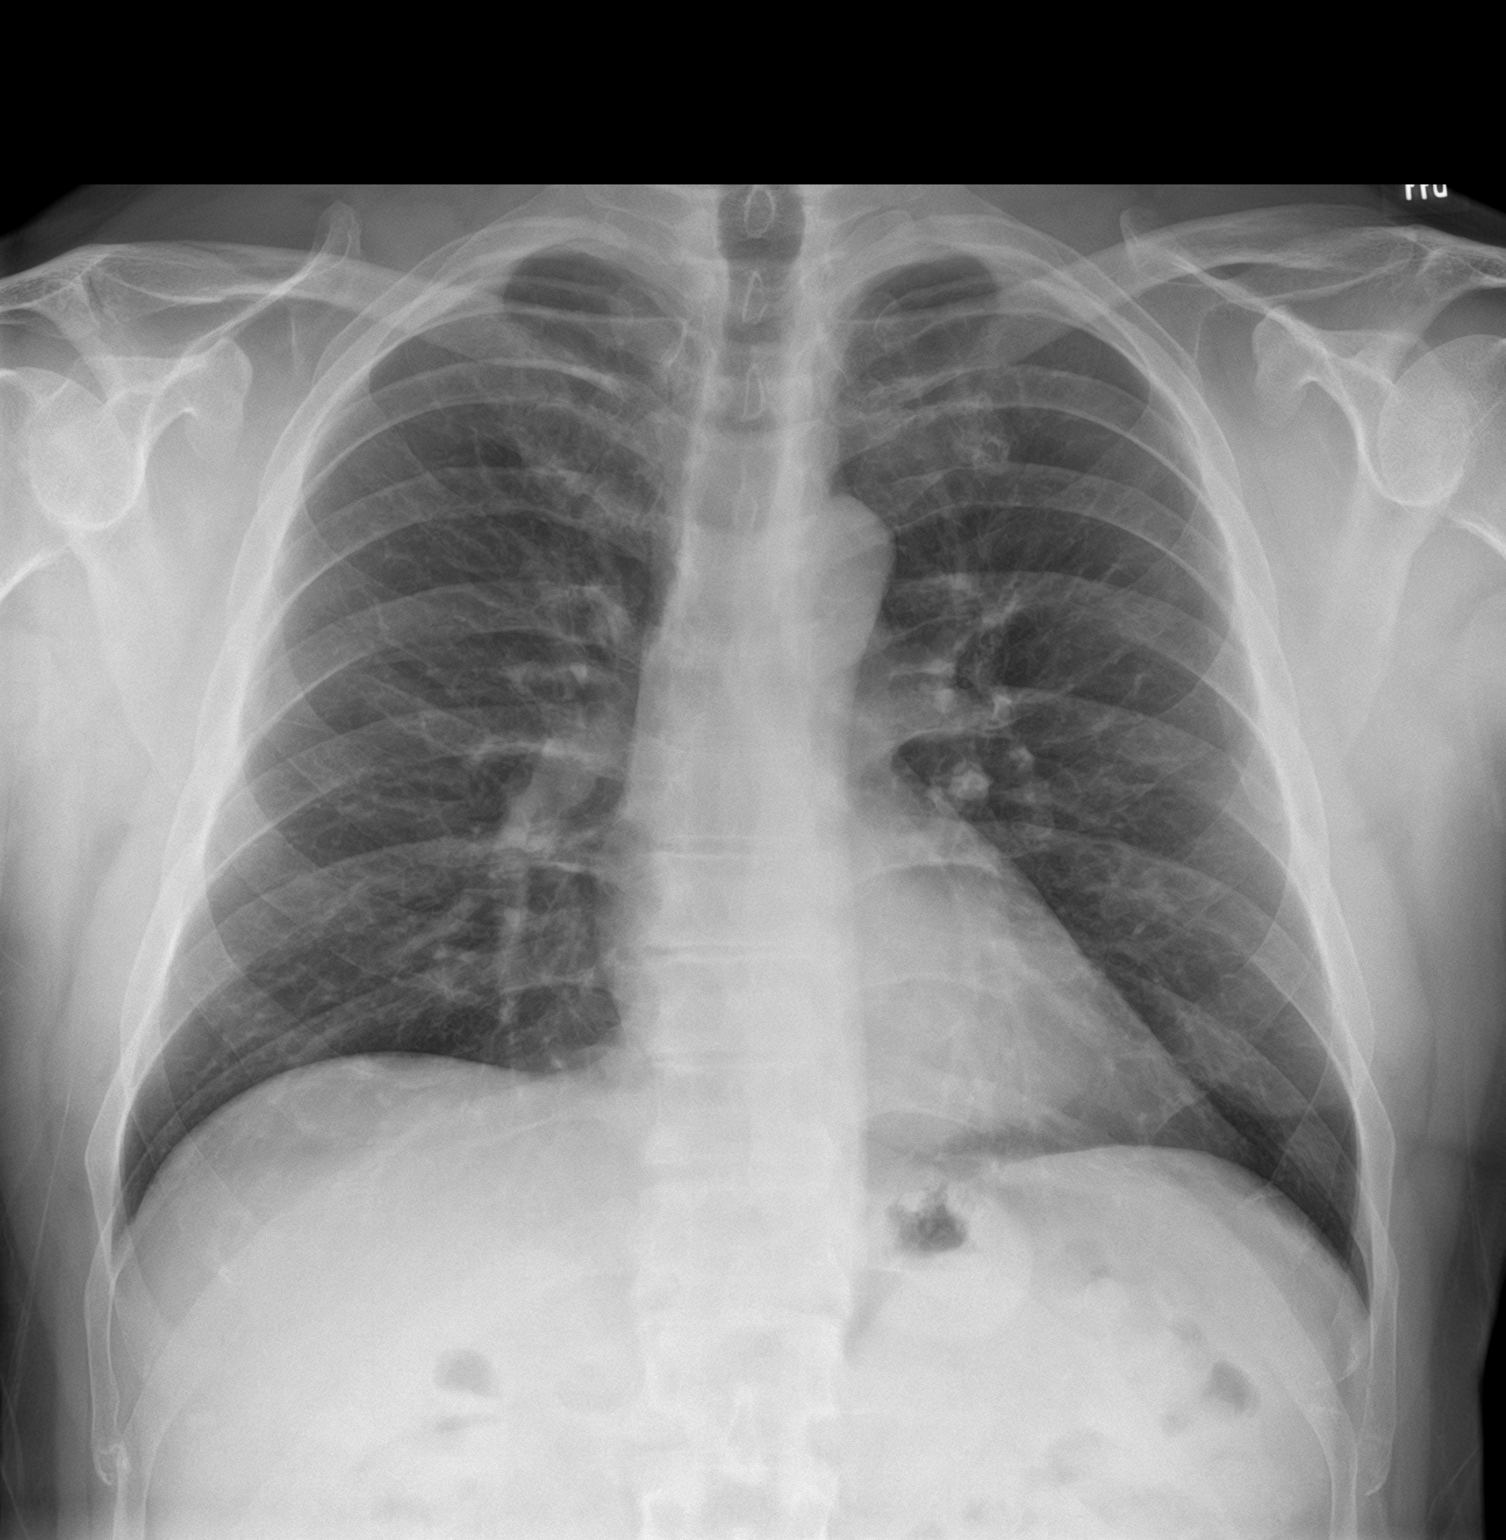

[chest lat]
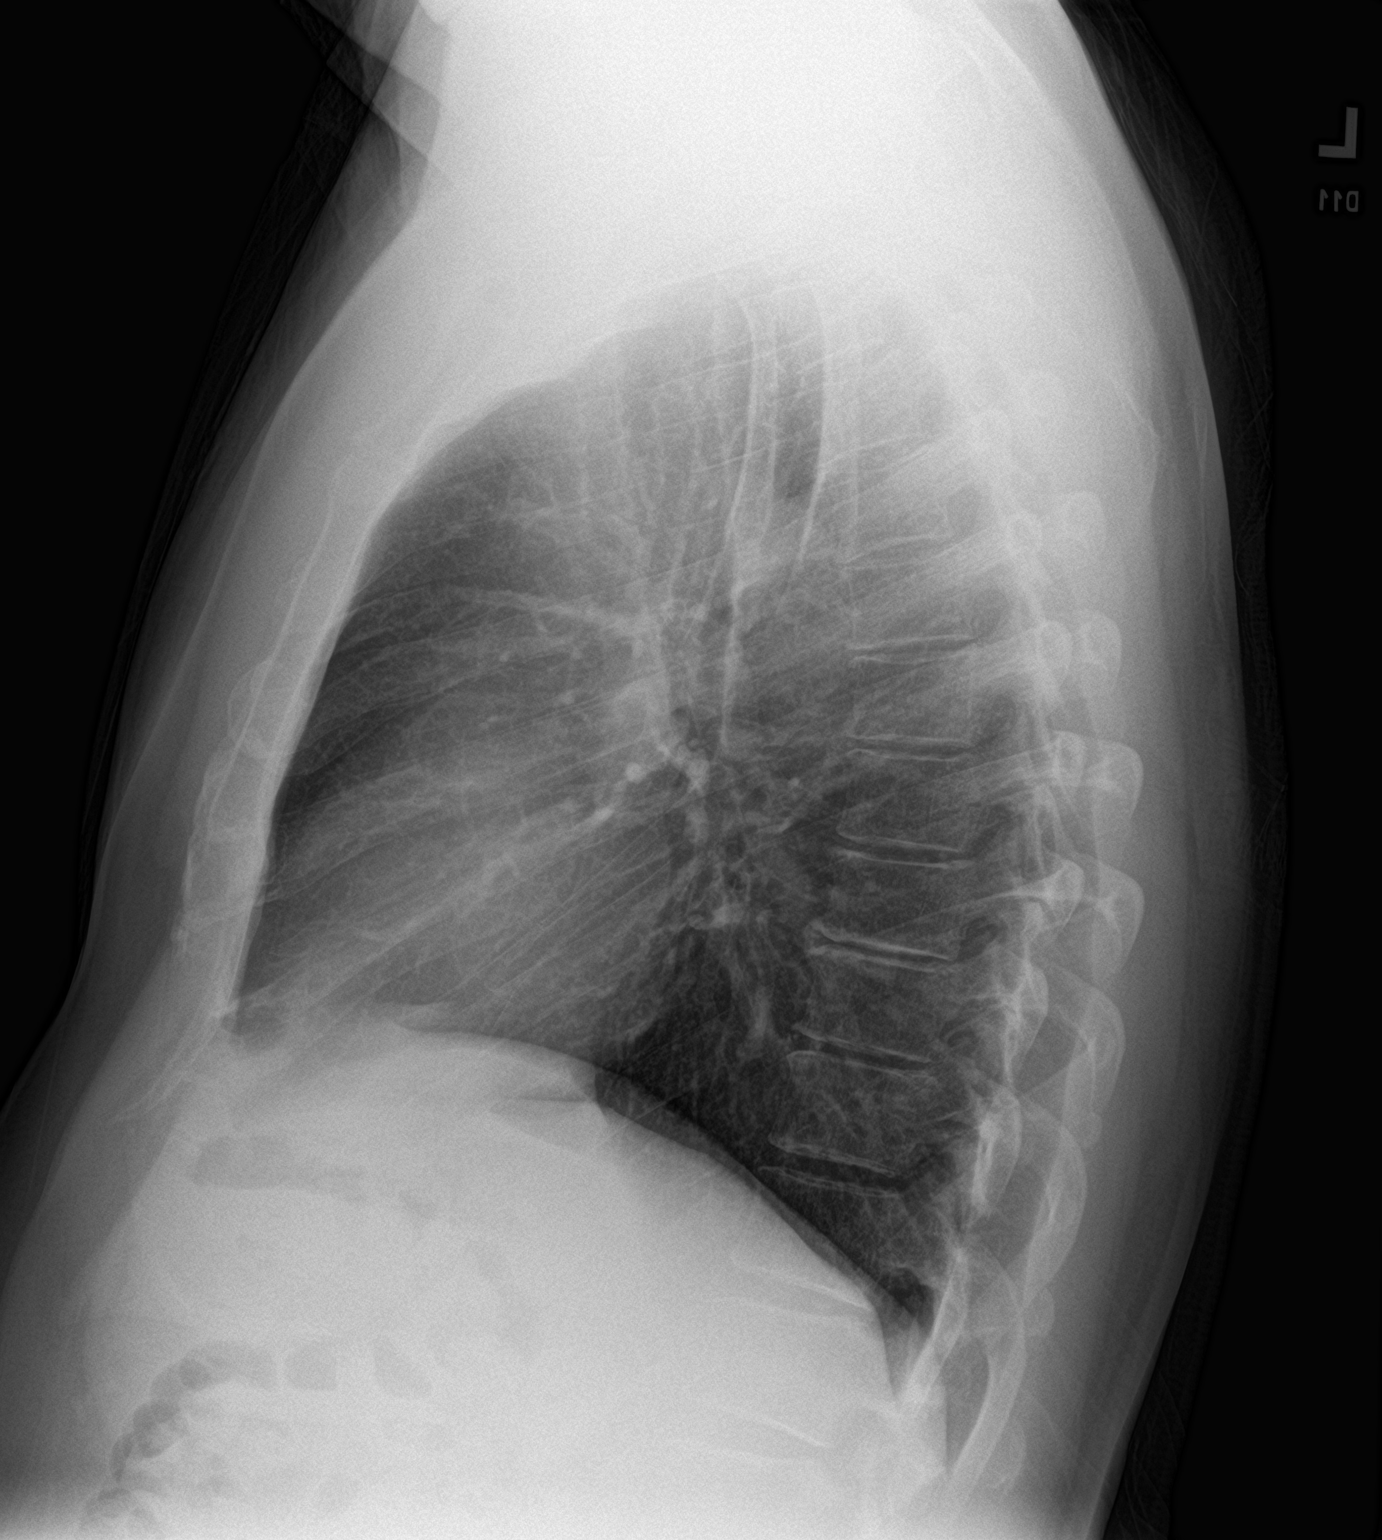

[2 of 2 positions shown; findings below may reference images not displayed]

FINDINGS: Cardiomediastinal silhouette unchanged in size and contour. No
evidence of central vascular congestion. No interlobular septal
thickening.

No pneumothorax or pleural effusion. Coarsened interstitial
markings, with no confluent airspace disease.

No acute displaced fracture. Degenerative changes of the spine.
IMPRESSION: Negative for acute cardiopulmonary disease

## 2023-01-18 ENCOUNTER — Other Ambulatory Visit: Payer: Self-pay | Admitting: Family Medicine

## 2023-01-18 MED ORDER — MECLIZINE HCL 25 MG PO TABS: 12.5000 mg | ORAL_TABLET | Freq: Three times a day (TID) | ORAL | 1 refills | Status: DC | PRN

## 2023-01-18 NOTE — Telephone Encounter (Signed)
Received request from pharmacy for refill. Was entered as historical do not see where given by our office ok to send as pended.

## 2023-01-18 NOTE — Telephone Encounter (Signed)
ERx 

## 2023-01-21 DIAGNOSIS — H8109 Meniere's disease, unspecified ear: Secondary | ICD-10-CM | POA: Diagnosis not present

## 2023-03-26 ENCOUNTER — Encounter: Payer: Self-pay | Admitting: Internal Medicine

## 2023-03-26 ENCOUNTER — Ambulatory Visit (INDEPENDENT_AMBULATORY_CARE_PROVIDER_SITE_OTHER): Payer: BC Managed Care – PPO | Admitting: Internal Medicine

## 2023-03-26 ENCOUNTER — Other Ambulatory Visit: Payer: BC Managed Care – PPO

## 2023-03-26 VITALS — BP 118/80 | HR 68 | Ht 67.0 in | Wt 197.0 lb

## 2023-03-26 DIAGNOSIS — K219 Gastro-esophageal reflux disease without esophagitis: Secondary | ICD-10-CM

## 2023-03-26 DIAGNOSIS — K5900 Constipation, unspecified: Secondary | ICD-10-CM

## 2023-03-26 DIAGNOSIS — R1013 Epigastric pain: Secondary | ICD-10-CM

## 2023-03-26 DIAGNOSIS — Z860100 Personal history of colon polyps, unspecified: Secondary | ICD-10-CM | POA: Diagnosis not present

## 2023-03-26 MED ORDER — OMEPRAZOLE 40 MG PO CPDR: 40.0000 mg | DELAYED_RELEASE_CAPSULE | Freq: Every day | ORAL | 3 refills | Status: DC

## 2023-03-26 NOTE — Patient Instructions (Signed)
Your provider has requested that you go to the basement level for lab work before leaving today. Press "B" on the elevator. The lab is located at the first door on the left as you exit the elevator.  We have sent the following medications to your pharmacy for you to pick up at your convenience:  Omeprazole  Take OTC Align daily for 3 weeks  Continue taking Miralax daily  _______________________________________________________  If your blood pressure at your visit was 140/90 or greater, please contact your primary care physician to follow up on this.  _______________________________________________________  If you are age 19 or older, your body mass index should be between 23-30. Your Body mass index is 30.85 kg/m. If this is out of the aforementioned range listed, please consider follow up with your Primary Care Provider.  If you are age 34 or younger, your body mass index should be between 19-25. Your Body mass index is 30.85 kg/m. If this is out of the aformentioned range listed, please consider follow up with your Primary Care Provider.   ________________________________________________________  The [facility] providers would like to encourage you to use [facility] to communicate with providers for non-urgent requests or questions.  Due to long hold times on the telephone, sending your provider a message by [facility] may be a faster and more efficient way to get a response.  Please allow 48 business hours for a response.  Please remember that this is for non-urgent requests.  _______________________________________________________

## 2023-03-26 NOTE — Progress Notes (Signed)
HISTORY OF PRESENT ILLNESS:  Christopher Contreras is a 67 y.o. 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  He was seen here remotely.  More recently at his GI care with Dr. Kinnie Scales.  Patient tells me that since developing COVID in December 2020 he has had a number of issues for which she has seen a number of specialist.  He was seen by Dr. Jennye Boroughs PA 03/03/2020.  Reviewed.  Patient tells me that his symptoms that are what he is being seen for now.  He describes that after eating he does not feel well.  Specifically he states that he feels weak and lethargic.  He does not have these issues when consuming liquids.  There is no nausea or vomiting.  His weight is stable.  No GI bleeding.  He does report a tendency toward constipation over the past 6 months.  He has used MiraLAX periodically.  Good bowel movements seem to help.  Does have a history of GERD.  Requires PPI to control symptoms.  Currently taking omeprazole every other day.  Wonders about medication side effects.  He did undergo colonoscopy and upper endoscopy March 08, 2020.  Upper endoscopy revealed hiatal hernia but was otherwise normal.  Colonoscopy revealed an adenomatous colon polyp in the right colon which was removed.  Follow-up in 5 years recommended.  Review of blood work from March 2024 shows unremarkable comprehensive metabolic panel including normal liver tests.  Previous lipase normal.  CBC December 2023 with normal hemoglobin of 16.6.  Wonders about dietary changes.  Possibly seeing a nutritionist.  Abdominal ultrasound June 2021 was unremarkable.  REVIEW OF SYSTEMS:  All non-GI ROS negative unless otherwise stated in the HPI except for anxiety, fatigue, confusion, sleeping problems  Past Medical History:  Diagnosis Date   Abdominal discomfort, epigastric 07/20/2016   Anxiety    ANXIETY 12/27/2008   Qualifier: Diagnosis of  By: Hetty Ely MD,  Franne Grip    Basal cell carcinoma 10/07/2019   Right sup. helix. Superficial and nodular.    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   Heartburn 08/16/2019   Hepatitis A was in 9th grade   HLD (hyperlipidemia) 2/08   Personal history of other infectious and parasitic disease    reports this as a hepatitis A infection in junior HS , denies any liver disorder    Prostate cancer (HCC)    Pseudocholinesterase deficiency    patient reports this runs in his family ; but is unsure if he has the trait; see anethesia history for family history    Shortness of breath 08/16/2019   Vestibular disorder, left    ear ; injury occurred in the military    Vestibular dysfunction of left ear 08/16/2019   Residual since military service 1980s    Past Surgical History:  Procedure Laterality Date   BASAL CELL CARCINOMA EXCISION  1/99   right face  CATARACT EXTRACTION, BILATERAL  about 2-4 years ago   with bilateral lens placement   COLONOSCOPY  03/29/01   Diverticulosis   COLONOSCOPY  02/15/10   Divertics-Dr. Marina Goodell   COLONOSCOPY  02/2020   1 TA, rpt 5 yrs ([facility])   ESOPHAGOGASTRODUODENOSCOPY  02/2020   4cm HH noninflamed, normal EGD otherwise ([facility])   LYMPHADENECTOMY Bilateral 09/15/2018   Procedure: LYMPHADENECTOMY, PELVIC;  Surgeon: Heloise Purpura, MD;  Location: [facility];  Service: Urology;  Laterality: Bilateral;   ROBOT ASSISTED LAPAROSCOPIC RADICAL PROSTATECTOMY N/A 09/15/2018   Procedure: XI ROBOTIC ASSISTED  LAPAROSCOPIC RADICAL PROSTATECTOMY LEVEL 2;  Heloise Purpura, MD)   VASECTOMY  pre 1999   Dr. Artis Flock    Social History Iver Nestle  reports that he has never smoked. He has never used smokeless tobacco. He reports that he does not currently use alcohol. He reports that he does not use drugs.  family history includes Bone cancer in his maternal grandmother; Colon cancer (age of onset: 25) in his mother; Colon polyps in his sister and sister; Diabetes in his paternal grandfather and son; Heart attack in an other family member; Heart failure in his father and maternal grandfather; Hypertension in an other family member; Other in his father; Prostate cancer in his father; Throat cancer in his paternal grandfather.  Allergies  Allergen Reactions   Sulfonamide Derivatives     Unknown, childhood allergy       PHYSICAL EXAMINATION: Vital signs: BP 118/80   Pulse 68   Ht 5\' 7"  (1.702 m)   Wt 197 lb (89.4 kg)   BMI 30.85 kg/m   Constitutional: generally well-appearing, no acute distress Psychiatric: alert and oriented x3, cooperative Eyes: extraocular movements intact, anicteric, conjunctiva pink Mouth: oral pharynx moist, no lesions Neck: supple no lymphadenopathy Cardiovascular: heart regular rate and rhythm, no murmur Lungs: clear to auscultation bilaterally Abdomen: soft, nontender, nondistended, no obvious ascites, no peritoneal signs, normal bowel sounds, no organomegaly Rectal: Omitted Extremities: no clubbing, cyanosis, or lower extremity edema bilaterally Skin: no lesions on visible extremities Neuro: No focal deficits.  Cranial nerves intact  ASSESSMENT:  1.  Vague dyspeptic symptoms.  Chronic (4 years) with prior negative workups.  No worrisome features. 2.  GERD.  Classic symptoms controlled with PPI 3.  Upper endoscopy October 2021 with hiatal hernia.  Otherwise normal. 4.  Colonoscopy October 2021 with diminutive adenoma.  Otherwise normal. 5.   Constipation   PLAN:  1.  Reflux precautions 2.  Encouraged to take PPI (omeprazole 40 mg) daily 3.  Encouraged to take MiraLAX daily to achieve daily bowel movements 4.  Recommend probiotic align 1 daily for 2 weeks.  May reuse on demand if helpful. 5.  Screen for celiac disease with tissue transglutaminase antibody IgA and serum IgA level 6.  Routine office follow-up 3 months.  Could consider gastric emptying scan. Total time of 60 minutes was spent preparing to see the patient, reviewing multiple outside records, procedure reports, pathology, laboratories, and x-rays.  Obtaining comprehensive history, performing medically appropriate physical examination, directing medical therapies, ordering laboratory studies, counseling and educating the patient regarding the above listed issues, and documenting clinical information in the health record

## 2023-03-27 ENCOUNTER — Telehealth: Payer: Self-pay | Admitting: Pharmacy Technician

## 2023-03-27 LAB — IGA: Immunoglobulin A: 173 mg/dL (ref 70–320)

## 2023-03-27 LAB — TISSUE TRANSGLUTAMINASE, IGA: (tTG) Ab, IgA: <1 U/mL

## 2023-03-27 NOTE — Telephone Encounter (Signed)
Pharmacy Patient Advocate Encounter   Received notification from CoverMyMeds that prior authorization for OMEPRAZOLE 40MG  is required/requested.   Insurance verification completed.   The patient is insured through [facility] .   Per test claim: PA required; PA submitted to above mentioned insurance via CoverMyMeds Key/confirmation #/EOC [facility] Status is pending

## 2023-05-09 ENCOUNTER — Encounter: Payer: Self-pay | Admitting: Dermatology

## 2023-05-09 ENCOUNTER — Ambulatory Visit: Payer: Medicare PPO | Admitting: Dermatology

## 2023-05-09 DIAGNOSIS — Z85828 Personal history of other malignant neoplasm of skin: Secondary | ICD-10-CM | POA: Diagnosis not present

## 2023-05-09 DIAGNOSIS — L821 Other seborrheic keratosis: Secondary | ICD-10-CM | POA: Diagnosis not present

## 2023-05-09 DIAGNOSIS — D229 Melanocytic nevi, unspecified: Secondary | ICD-10-CM

## 2023-05-09 DIAGNOSIS — D1801 Hemangioma of skin and subcutaneous tissue: Secondary | ICD-10-CM | POA: Diagnosis not present

## 2023-05-09 DIAGNOSIS — L814 Other melanin hyperpigmentation: Secondary | ICD-10-CM | POA: Diagnosis not present

## 2023-05-09 DIAGNOSIS — W908XXA Exposure to other nonionizing radiation, initial encounter: Secondary | ICD-10-CM

## 2023-05-09 DIAGNOSIS — L578 Other skin changes due to chronic exposure to nonionizing radiation: Secondary | ICD-10-CM

## 2023-05-09 DIAGNOSIS — L570 Actinic keratosis: Secondary | ICD-10-CM

## 2023-05-09 DIAGNOSIS — Z1283 Encounter for screening for malignant neoplasm of skin: Secondary | ICD-10-CM | POA: Diagnosis not present

## 2023-05-09 NOTE — Progress Notes (Signed)
   Follow-Up Visit   Subjective  Christopher Contreras is a 67 y.o. male who presents for the following: Skin Cancer Screening and Full Body Skin Exam  The patient presents for Total-Body Skin Exam (TBSE) for skin cancer screening and mole check. The patient has spots, moles and lesions to be evaluated, some may be new or changing and the patient may have concern these could be cancer.  Hx Bcc. Patient has noticed a spot at crown scalp and a scaly spot at right nose.   The following portions of the chart were reviewed this encounter and updated as appropriate: medications, allergies, medical history  Review of Systems:  No other skin or systemic complaints except as noted in HPI or Assessment and Plan.  Objective  Well appearing patient in no apparent distress; mood and affect are within normal limits.  A full examination was performed including scalp, head, eyes, ears, nose, lips, neck, chest, axillae, abdomen, back, buttocks, bilateral upper extremities, bilateral lower extremities, hands, feet, fingers, toes, fingernails, and toenails. All findings within normal limits unless otherwise noted below.   Relevant physical exam findings are noted in the Assessment and Plan.  Scalp x 2, R nasal bridge x 1 (3) Erythematous thin papules/macules with gritty scale.   Assessment & Plan   SKIN CANCER SCREENING PERFORMED TODAY.  ACTINIC DAMAGE - Chronic condition, secondary to cumulative UV/sun exposure - diffuse scaly erythematous macules with underlying dyspigmentation - Recommend daily broad spectrum sunscreen SPF 30+ to sun-exposed areas, reapply every 2 hours as needed.  - Staying in the shade or wearing long sleeves, sun glasses (UVA+UVB protection) and wide brim hats (4-inch brim around the entire circumference of the hat) are also recommended for sun protection.  - Call for new or changing lesions.  LENTIGINES, SEBORRHEIC KERATOSES, HEMANGIOMAS - Benign normal skin lesions -  Benign-appearing - Call for any changes  MELANOCYTIC NEVI - Tan-brown and/or pink-flesh-colored symmetric macules and papules - Benign appearing on exam today - Observation - Call clinic for new or changing moles - Recommend daily use of broad spectrum spf 30+ sunscreen to sun-exposed areas.   HISTORY OF BASAL CELL CARCINOMA OF THE SKIN - No evidence of recurrence today - Recommend regular full body skin exams - Recommend daily broad spectrum sunscreen SPF 30+ to sun-exposed areas, reapply every 2 hours as needed.  - Call if any new or changing lesions are noted between office visits AK (ACTINIC KERATOSIS) (3) Scalp x 2, R nasal bridge x 1 (3) Destruction of lesion - Scalp x 2, R nasal bridge x 1 (3)  Destruction method: cryotherapy   Informed consent: discussed and consent obtained   Lesion destroyed using liquid nitrogen: Yes   Cryotherapy cycles:  2 Outcome: patient tolerated procedure well with no complications   Post-procedure details: wound care instructions given   ACTINIC SKIN DAMAGE   HISTORY OF BASAL CELL CARCINOMA   LENTIGO   MELANOCYTIC NEVUS, UNSPECIFIED LOCATION   SKIN CANCER SCREENING   Return in about 1 year (around 05/08/2024) for TBSE, with Dr. Kirtland Bouchard, Hx BCC.  Anise Salvo, RMA, am acting as scribe for Armida Sans, MD .   Documentation: I have reviewed the above documentation for accuracy and completeness, and I agree with the above.  Armida Sans, MD

## 2023-05-09 NOTE — Patient Instructions (Signed)

## 2023-05-14 ENCOUNTER — Encounter: Payer: Self-pay | Admitting: Dermatology

## 2023-05-30 ENCOUNTER — Other Ambulatory Visit (HOSPITAL_COMMUNITY): Payer: Self-pay

## 2023-06-21 ENCOUNTER — Encounter: Payer: Self-pay | Admitting: Internal Medicine

## 2023-06-21 ENCOUNTER — Ambulatory Visit (INDEPENDENT_AMBULATORY_CARE_PROVIDER_SITE_OTHER): Payer: Medicare PPO | Admitting: Internal Medicine

## 2023-06-21 VITALS — BP 110/60 | HR 71 | Ht 67.0 in | Wt 187.4 lb

## 2023-06-21 DIAGNOSIS — Z800 Family history of malignant neoplasm of digestive organs: Secondary | ICD-10-CM | POA: Diagnosis not present

## 2023-06-21 DIAGNOSIS — Z860101 Personal history of adenomatous and serrated colon polyps: Secondary | ICD-10-CM

## 2023-06-21 DIAGNOSIS — K219 Gastro-esophageal reflux disease without esophagitis: Secondary | ICD-10-CM

## 2023-06-21 DIAGNOSIS — K5909 Other constipation: Secondary | ICD-10-CM | POA: Diagnosis not present

## 2023-06-21 DIAGNOSIS — R1013 Epigastric pain: Secondary | ICD-10-CM

## 2023-06-21 DIAGNOSIS — Z860100 Personal history of colon polyps, unspecified: Secondary | ICD-10-CM

## 2023-06-21 DIAGNOSIS — K5900 Constipation, unspecified: Secondary | ICD-10-CM

## 2023-06-21 NOTE — Progress Notes (Signed)
HISTORY OF PRESENT ILLNESS:  Christopher Contreras is a 68 y.o. 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  At that time he was advised with regards to reflux precautions, encouraged to take his PPI dose to control reflux symptoms (omeprazole 40 mg daily), MiraLAX for constipation, and probiotic align for a few weeks.  He was screened for celiac disease, which was normal.  Follow-up in 3 months recommended.  He indeed, follows up at this time.  Patient tells me that he retired at the end of December.  Since then he has been involved with intermittent fasting and exercise.  He is lost 10 pounds.  He feels much better.  In terms of reflux, he takes omeprazole 20 mg daily and occasional Pepcid if needed.  For his bowels he uses MiraLAX regularly.  This helps.  No new complaints or issues.  His last colonoscopy was October 2021 with an adenomatous polyp.  Follow-up in 5 years recommended due to history of polyps as well as colon cancer in his mother around age 92  REVIEW OF SYSTEMS:  All non-GI ROS negative. Past Medical History:  Diagnosis Date   Abdominal discomfort, epigastric 07/20/2016   Anxiety    ANXIETY 12/27/2008   Qualifier: Diagnosis of  By: Hetty Ely MD, Franne Grip    Basal cell carcinoma 10/07/2019   Right sup. helix. Superficial and nodular.    Basal cell carcinoma of face    R neck and back txted in past   BPH (benign prostatic hyperplasia) 11/12/2016   S/p benign prostate biopsy by Dr Evelene Croon (10/2016)   Dehydration 08/16/2019   Disorders of bilirubin excretion    Diverticulosis of colon 11/02   DIVERTICULOSIS, COLON 03/17/2008   Qualifier: Diagnosis of  By: Hetty Ely MD, Franne Grip    Erectile dysfunction 02/13/2017   Family history of adverse reaction to anesthesia    mother and father diffucult to arouse    Fatigue 07/20/2016   Sullivan Lone  syndrome 03/17/2008   Heartburn 08/16/2019   Hepatitis A was in 9th grade   HLD (hyperlipidemia) 2/08   Personal history of other infectious and parasitic disease    reports this as a hepatitis A infection in junior HS , denies any liver disorder    Prostate cancer (HCC)    Pseudocholinesterase deficiency    patient reports this runs in his family ; but is unsure if he has the trait; see anethesia history for family history    Shortness of breath 08/16/2019   Vestibular disorder, left    ear ; injury occurred in the military    Vestibular dysfunction of left ear 08/16/2019   Residual since military service 1980s    Past Surgical History:  Procedure Laterality Date   BASAL CELL CARCINOMA EXCISION  1/99   right face   CATARACT EXTRACTION, BILATERAL  about 2-4 years ago   with bilateral lens placement   COLONOSCOPY  03/29/01   Diverticulosis   COLONOSCOPY  02/15/10   Divertics-Dr. Marina Goodell   COLONOSCOPY  02/2020   1 TA, rpt 5 yrs ([facility])   ESOPHAGOGASTRODUODENOSCOPY  02/2020   4cm HH noninflamed, normal EGD otherwise ([facility])   LYMPHADENECTOMY Bilateral 09/15/2018   Procedure: LYMPHADENECTOMY, PELVIC;  Surgeon: Heloise Purpura, MD;  Location: [facility];  Service: Urology;  Laterality: Bilateral;   ROBOT ASSISTED LAPAROSCOPIC RADICAL PROSTATECTOMY N/A 09/15/2018   Procedure: XI ROBOTIC  ASSISTED LAPAROSCOPIC RADICAL PROSTATECTOMY LEVEL 2;  Heloise Purpura, MD)   VASECTOMY  pre 1999   Dr. Artis Flock    Social History Iver Nestle  reports that he has never smoked. He has never used smokeless tobacco. He reports that he does not currently use alcohol. He reports that he does not use drugs.  family history includes Bone cancer in his maternal grandmother; Colon cancer (age of onset: 47) in his mother; Diabetes in his paternal grandfather and son; Heart attack in an other family member; Heart failure in his father and maternal grandfather; Hypertension in an other family member; Other in his  father; Prostate cancer in his father; Throat cancer in his paternal grandfather; Thyroid cancer in his sister.  Allergies  Allergen Reactions   Sulfonamide Derivatives     Unknown, childhood allergy       PHYSICAL EXAMINATION: Vital signs: BP 110/60 (BP Location: Left Arm, Patient Position: Sitting, Cuff Size: Normal)   Pulse 71   Ht 5\' 7"  (1.702 m)   Wt 187 lb 6 oz (85 kg)   BMI 29.35 kg/m   Constitutional: generally well-appearing, no acute distress Psychiatric: alert and oriented x3, cooperative Eyes: extraocular movements intact, anicteric, conjunctiva pink Mouth: oral pharynx moist, no lesions Neck: supple no lymphadenopathy Cardiovascular: heart regular rate and rhythm, no murmur Lungs: clear to auscultation bilaterally Abdomen: soft, nontender, nondistended, no obvious ascites, no peritoneal signs, normal bowel sounds, no organomegaly Rectal: Omitted Extremities: no clubbing, cyanosis, or lower extremity edema bilaterally Skin: no lesions on visible extremities Neuro: No focal deficits.  Cranial nerve is intact  ASSESSMENT:  1.  GERD.  No alarm features.  Controlled with low-dose PPI and as needed famotidine.  Last EGD with Dr. Kinnie Scales October 2021 revealed hiatal hernia, but otherwise unremarkable examination 2.  Chronic constipation.  Managed with MiraLAX. 3.  Family history of colon cancer in mother and personal history of adenomatous polyps.  Last colonoscopy with Dr. Kinnie Scales March 08, 2020.  Diminutive polyp.  Otherwise normal.  Follow-up in 5 years. 4.  Dyspeptic symptoms.  Resolved   PLAN:  1.  Reflux precautions 2.  Continue PPI.  Lowest dose that controls reflux symptoms 3.  Continue MiraLAX for constipation.  Adjust as needed 4.  Surveillance colonoscopy around October 2026. 5.  Interval follow-up as needed

## 2023-06-21 NOTE — Patient Instructions (Signed)
You will be due for a colonoscopy in October of 2026.  Please follow up as needed.  _______________________________________________________  If your blood pressure at your visit was 140/90 or greater, please contact your primary care physician to follow up on this.  _______________________________________________________  If you are age 66 or older, your body mass index should be between 23-30. Your Body mass index is 29.35 kg/m. If this is out of the aforementioned range listed, please consider follow up with your Primary Care Provider.  If you are age 57 or younger, your body mass index should be between 19-25. Your Body mass index is 29.35 kg/m. If this is out of the aformentioned range listed, please consider follow up with your Primary Care Provider.   ________________________________________________________  The [facility] providers would like to encourage you to use [facility] to communicate with providers for non-urgent requests or questions.  Due to long hold times on the telephone, sending your provider a message by [facility] may be a faster and more efficient way to get a response.  Please allow 48 business hours for a response.  Please remember that this is for non-urgent requests.  _______________________________________________________

## 2023-10-25 DIAGNOSIS — C61 Malignant neoplasm of prostate: Secondary | ICD-10-CM | POA: Diagnosis not present

## 2023-11-01 DIAGNOSIS — N5201 Erectile dysfunction due to arterial insufficiency: Secondary | ICD-10-CM | POA: Diagnosis not present

## 2023-11-01 DIAGNOSIS — C61 Malignant neoplasm of prostate: Secondary | ICD-10-CM | POA: Diagnosis not present

## 2024-01-22 NOTE — Progress Notes (Unsigned)
 No chief complaint on file.  History of Present Illness: 68 yo Contreras with history of anxiety, prostate cancer, Gilbert Syndrome, HTN, GERD and HLD here today for follow up. I saw him as a new patient 10/19/19 for the evaluation of elevated blood pressure and chest pain. He had Covid 19 in January 2021. He went to the ED at [facility] mid March 2021 feeling poorly. BP was elevated. Troponin negative and other labs unrevealing. Chest x-ray normal. He had several more spikes in his blood pressure. Echo 10/22/19 with LVEF=60-65%, no valve disease. Coronary CTA June 2021 with no evidence of CAD, calcium score zero. Exercise stress test January 2024 with no ischemia. Echo January 2024 with LVEF=65%. No valve abnormalities.   He is here today for follow up. The patient denies any chest pain, dyspnea, palpitations, lower extremity edema, orthopnea, PND, dizziness, near syncope or syncope.   Primary Care Physician: Rilla Baller, MD  Past Medical History:  Diagnosis Date   Abdominal discomfort, epigastric 07/20/2016   Anxiety    ANXIETY 12/27/2008   Qualifier: Diagnosis of  By: Bartley MD, Lamar Mulch    Basal cell carcinoma 10/07/2019   Right sup. helix. Superficial and nodular.    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   Heartburn 08/16/2019   Hepatitis A was in 9th grade   HLD (hyperlipidemia) 2/08   Personal history of other infectious and parasitic disease    reports this as a hepatitis A infection in junior HS , denies  any liver disorder    Prostate cancer (HCC)    Pseudocholinesterase deficiency    patient reports this runs in his family ; but is unsure if he has the trait; see anethesia history for family history    Shortness of breath 08/16/2019   Vestibular disorder, left    ear ; injury occurred in the military    Vestibular dysfunction of left ear 08/16/2019   Residual since military service 1980s    Past Surgical History:  Procedure Laterality Date   BASAL CELL CARCINOMA EXCISION  1/99   right face   CATARACT EXTRACTION, BILATERAL  about 2-4 years ago   with bilateral lens placement   COLONOSCOPY  03/29/01   Diverticulosis   COLONOSCOPY  02/15/10   Divertics-Dr. Abran   COLONOSCOPY  02/2020   1 TA, rpt 5 yrs ([facility])   ESOPHAGOGASTRODUODENOSCOPY  02/2020   4cm HH noninflamed, normal EGD otherwise ([facility])   LYMPHADENECTOMY Bilateral 09/15/2018   Procedure: LYMPHADENECTOMY, PELVIC;  Surgeon: Renda Glance, MD;  Location: [facility];  Service: Urology;  Laterality: Bilateral;   ROBOT ASSISTED LAPAROSCOPIC RADICAL PROSTATECTOMY N/A 09/15/2018   Procedure: XI ROBOTIC ASSISTED LAPAROSCOPIC RADICAL PROSTATECTOMY LEVEL 2;  Raynelle Glance, MD)   VASECTOMY  pre 1999   Dr. Waddell    Current Outpatient Medications  Medication Sig Dispense Refill   b complex vitamins capsule Take 1 capsule by mouth daily.     Magnesium  500 MG CAPS  Take 1 capsule (500 mg total) by mouth in the morning and at bedtime.     meclizine  (ANTIVERT ) 25 MG tablet Take 0.5 tablets (12.5 mg total) by mouth 3 (three) times daily as needed for dizziness. 30 tablet 1   MISC NATURAL PRODUCTS PO Take by mouth daily. Verve vitamin supplement drink     omeprazole  (PRILOSEC) 40 MG capsule Take 1 capsule (40 mg total) by mouth daily. (Patient taking differently: Take 20 mg by mouth daily.) 90 capsule 3   polyethylene glycol powder (GLYCOLAX/MIRALAX) 17 GM/SCOOP powder Take 1 Container by mouth once.     propranolol  (INDERAL ) 10 MG tablet  Take 1 tablet (10 mg total) by mouth 2 (two) times daily. (Patient taking differently: Take 5 mg by mouth daily.) 180 tablet 4   No current facility-administered medications for this visit.    Allergies  Allergen Reactions   Sulfonamide Derivatives     Unknown, childhood allergy    Social History   Socioeconomic History   Marital status: Married    Spouse name: Not on file   Number of children: 3   Years of education: Not on file   Highest education level: Bachelor's degree (e.g., BA, AB, BS)  Occupational History   Occupation: Works as an Print production planner  Tobacco Use   Smoking status: Never   Smokeless tobacco: Never  Vaping Use   Vaping status: Never Used  Substance and Sexual Activity   Alcohol use: Not Currently    Comment: Rare   Drug use: No   Sexual activity: Yes  Other Topics Concern   Not on file  Social History Narrative   Married; lives with wife. 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   Social Drivers of Corporate investment banker Strain: Low Risk  (09/20/2022)   Overall Financial Resource Strain (CARDIA)    Difficulty of Paying Living Expenses: Not hard at all  Food Insecurity: No Food Insecurity (09/20/2022)   Hunger Vital Sign    Worried About Running Out of Food in the Last Year: Never true    Ran Out of Food in the Last Year: Never true  Transportation Needs: No Transportation Needs (09/20/2022)   PRAPARE - Administrator, Civil Service (Medical): No    Lack of Transportation (Non-Medical): No  Physical Activity: Insufficiently Active (09/20/2022)   Exercise Vital Sign    Days of Exercise per Week: 4 days    Minutes of Exercise per Session: 30 min  Stress: No Stress Concern Present (09/20/2022)   [employer] of Occupational Health - Occupational Stress Questionnaire    Feeling of Stress : Not at all  Social Connections: Socially Integrated (09/20/2022)   Social Connection and Isolation Panel    Frequency  of Communication with Friends and Family: More than three times a week    Frequency of Social Gatherings with Friends and Family: Once a week    Attends Religious Services: More than 4 times per year    Active Member of [employer] or Organizations: Yes    Attends Engineer, structural: More than 4 times per year    Marital Status: Married  Catering manager Violence: Not on file    Family History  Problem Relation Age of Onset   Colon cancer Mother 71   Prostate cancer Father        s/p prostatectomy   Other Father        Back pain with  neuropathy   Heart failure Father    Thyroid  cancer Sister    Bone cancer Maternal Grandmother    Heart failure Maternal Grandfather    Diabetes Paternal Grandfather    Throat cancer Paternal Grandfather    Diabetes Son    Heart attack Other        Maternal side   Hypertension Other        Both sides   Liver disease Neg Hx    Esophageal cancer Neg Hx     Review of Systems:  As stated in the HPI and otherwise negative.   There were no vitals taken for this visit.  Physical Examination: General: Well developed, well nourished, NAD  HEENT: OP clear, mucus membranes moist  SKIN: warm, dry. No rashes. Neuro: No focal deficits  Musculoskeletal: Muscle strength 5/5 all ext  Psychiatric: Mood and affect normal  Neck: No JVD, no carotid bruits, no thyromegaly, no lymphadenopathy.  Lungs:Clear bilaterally, no wheezes, rhonci, crackles Cardiovascular: Regular rate and rhythm. No murmurs, gallops or rubs. Abdomen:Soft. Bowel sounds present. Non-tender.  Extremities: No lower extremity edema. Pulses are 2 + in the bilateral DP/PT.  EKG:  EKG is *** ordered today. The ekg ordered today demonstrates   Recent Labs: No results found for requested labs within last 365 days.    Wt Readings from Last 3 Encounters:  06/21/23 187 lb 6 oz (85 kg)  03/26/23 197 lb (89.4 kg)  12/05/22 202 lb (91.6 kg)    Assessment and Plan:   1. Chest pain: His  chest pain is not felt to be cardiac related. Chest pain resolved with treatment of GERd. Coronary CTA in 2021 with calcium score zero and no evidence of CAD. Echo in January 2024 with normal LV systolic function.  Exercise stress test in January 2024 without ischemia.   2. HTN: BP is well controlled. Continue current therpy  Labs/ tests ordered today include:  No orders of the defined types were placed in this encounter.  Disposition:   FU with me in one year   Signed, Lonni Cash, MD 01/22/2024 4:36 PM    [facility] [address] [place], [place], [place]  [address] Phone: [phone]; Fax: [phone]

## 2024-01-23 ENCOUNTER — Ambulatory Visit: Attending: Cardiology | Admitting: Cardiovascular Disease

## 2024-01-23 VITALS — BP 114/62 | HR 60 | Ht 67.0 in | Wt 186.0 lb

## 2024-01-23 DIAGNOSIS — R0789 Other chest pain: Secondary | ICD-10-CM | POA: Diagnosis not present

## 2024-01-23 DIAGNOSIS — I10 Essential (primary) hypertension: Secondary | ICD-10-CM

## 2024-01-23 NOTE — Patient Instructions (Signed)
Medication Instructions:  No changes *If you need a refill on your cardiac medications before your next appointment, please call your pharmacy*   Lab Work: none   Testing/Procedures: none   Follow-Up: As needed   

## 2024-02-12 ENCOUNTER — Encounter: Payer: Self-pay | Admitting: Dermatology

## 2024-02-12 ENCOUNTER — Ambulatory Visit: Admitting: Dermatology

## 2024-02-12 DIAGNOSIS — L821 Other seborrheic keratosis: Secondary | ICD-10-CM

## 2024-02-12 DIAGNOSIS — C4441 Basal cell carcinoma of skin of scalp and neck: Secondary | ICD-10-CM | POA: Diagnosis not present

## 2024-02-12 DIAGNOSIS — D229 Melanocytic nevi, unspecified: Secondary | ICD-10-CM

## 2024-02-12 DIAGNOSIS — L578 Other skin changes due to chronic exposure to nonionizing radiation: Secondary | ICD-10-CM

## 2024-02-12 DIAGNOSIS — Z85828 Personal history of other malignant neoplasm of skin: Secondary | ICD-10-CM

## 2024-02-12 DIAGNOSIS — D492 Neoplasm of unspecified behavior of bone, soft tissue, and skin: Secondary | ICD-10-CM | POA: Diagnosis not present

## 2024-02-12 DIAGNOSIS — Z1283 Encounter for screening for malignant neoplasm of skin: Secondary | ICD-10-CM | POA: Diagnosis not present

## 2024-02-12 DIAGNOSIS — L814 Other melanin hyperpigmentation: Secondary | ICD-10-CM

## 2024-02-12 DIAGNOSIS — W908XXA Exposure to other nonionizing radiation, initial encounter: Secondary | ICD-10-CM

## 2024-02-12 DIAGNOSIS — L570 Actinic keratosis: Secondary | ICD-10-CM

## 2024-02-12 DIAGNOSIS — C4491 Basal cell carcinoma of skin, unspecified: Secondary | ICD-10-CM

## 2024-02-12 DIAGNOSIS — L820 Inflamed seborrheic keratosis: Secondary | ICD-10-CM | POA: Diagnosis not present

## 2024-02-12 DIAGNOSIS — D1801 Hemangioma of skin and subcutaneous tissue: Secondary | ICD-10-CM

## 2024-02-12 HISTORY — DX: Basal cell carcinoma of skin, unspecified: C44.91

## 2024-02-12 NOTE — Patient Instructions (Addendum)
 Wound Care Instructions  Cleanse wound gently with shampoo and water  once a day then pat dry with clean gauze. Apply a thin coat of Petrolatum (petroleum jelly, Vaseline) over the wound (unless you have an allergy to this). We recommend that you use a new, sterile tube of Vaseline. Do not pick or remove scabs. Do not remove the yellow or white healing tissue from the base of the wound.  Cover the wound with fresh, clean, nonstick gauze and secure with paper tape. You may use Band-Aids in place of gauze and tape if the wound is small enough, but would recommend trimming much of the tape off as there is often too much. Sometimes Band-Aids can irritate the skin.  You should call the office for your biopsy report after 1 week if you have not already been contacted.  If you experience any problems, such as abnormal amounts of bleeding, swelling, significant bruising, significant pain, or evidence of infection, please call the office immediately.  FOR ADULT SURGERY PATIENTS: If you need something for pain relief you may take 1 extra strength Tylenol  (acetaminophen ) AND 2 Ibuprofen (200mg  each) together every 4 hours as needed for pain. (do not take these if you are allergic to them or if you have a reason you should not take them.) Typically, you may only need pain medication for 1 to 3 days.     Cryotherapy Aftercare  Wash gently with soap and water  everyday.   Apply Vaseline Jelly daily until healed.     Recommend daily broad spectrum sunscreen SPF 30+ to sun-exposed areas, reapply every 2 hours as needed. Call for new or changing lesions.  Staying in the shade or wearing long sleeves, sun glasses (UVA+UVB protection) and wide brim hats (4-inch brim around the entire circumference of the hat) are also recommended for sun protection.     Melanoma ABCDEs  Melanoma is the most dangerous type of skin cancer, and is the leading cause of death from skin disease.  You are more likely to develop  melanoma if you: Have light-colored skin, light-colored eyes, or red or blond hair Spend a lot of time in the sun Tan regularly, either outdoors or in a tanning bed Have had blistering sunburns, especially during childhood Have a close family member who has had a melanoma Have atypical moles or large birthmarks  Early detection of melanoma is key since treatment is typically straightforward and cure rates are extremely high if we catch it early.   The first sign of melanoma is often a change in a mole or a new dark spot.  The ABCDE system is a way of remembering the signs of melanoma.  A for asymmetry:  The two halves do not match. B for border:  The edges of the growth are irregular. C for color:  A mixture of colors are present instead of an even brown color. D for diameter:  Melanomas are usually (but not always) greater than 6mm - the size of a pencil eraser. E for evolution:  The spot keeps changing in size, shape, and color.  Please check your skin once per month between visits. You can use a small mirror in front and a large mirror behind you to keep an eye on the back side or your body.   If you see any new or changing lesions before your next follow-up, please call to schedule a visit.  Please continue daily skin protection including broad spectrum sunscreen SPF 30+ to sun-exposed areas, reapplying every 2  hours as needed when you're outdoors.   Staying in the shade or wearing long sleeves, sun glasses (UVA+UVB protection) and wide brim hats (4-inch brim around the entire circumference of the hat) are also recommended for sun protection.      Due to recent changes in healthcare laws, you may see results of your pathology and/or laboratory studies on MyChart before the doctors have had a chance to review them. We understand that in some cases there may be results that are confusing or concerning to you. Please understand that not all results are received at the same time and often  the doctors may need to interpret multiple results in order to provide you with the best plan of care or course of treatment. Therefore, we ask that you please give us  2 business days to thoroughly review all your results before contacting the office for clarification. Should we see a critical lab result, you will be contacted sooner.   If You Need Anything After Your Visit  If you have any questions or concerns for your doctor, please call our main line at [phone] and press option 4 to reach your doctor's medical assistant. If no one answers, please leave a voicemail as directed and we will return your call as soon as possible. Messages left after 4 pm will be answered the following business day.   You may also send us  a message via MyChart. We typically respond to MyChart messages within 1-2 business days.  For prescription refills, please ask your pharmacy to contact our office. Our fax number is [phone].  If you have an urgent issue when the clinic is closed that cannot wait until the next business day, you can page your doctor at the number below.    Please note that while we do our best to be available for urgent issues outside of office hours, we are not available 24/7.   If you have an urgent issue and are unable to reach us , you may choose to seek medical care at your doctor's office, retail clinic, urgent care center, or emergency room.  If you have a medical emergency, please immediately call 911 or go to the emergency department.  Pager Numbers  - Dr. Hester: [phone]  - Dr. Jackquline: [phone]  - Dr. Claudene: [phone]   - Dr. Raymund: [phone]  In the event of inclement weather, please call our main line at [phone] for an update on the status of any delays or closures.  Dermatology Medication Tips: Please keep the boxes that topical medications come in in order to help keep track of the instructions about where and how to use these. Pharmacies  typically print the medication instructions only on the boxes and not directly on the medication tubes.   If your medication is too expensive, please contact our office at [phone] option 4 or send us  a message through MyChart.   We are unable to tell what your co-pay for medications will be in advance as this is different depending on your insurance coverage. However, we may be able to find a substitute medication at lower cost or fill out paperwork to get insurance to cover a needed medication.   If a prior authorization is required to get your medication covered by your insurance company, please allow us  1-2 business days to complete this process.  Drug prices often vary depending on where the prescription is filled and some pharmacies may offer cheaper prices.  The website www.goodrx.com contains coupons for medications through different  pharmacies. The prices here do not account for what the cost may be with help from insurance (it may be cheaper with your insurance), but the website can give you the price if you did not use any insurance.  - You can print the associated coupon and take it with your prescription to the pharmacy.  - You may also stop by our office during regular business hours and pick up a GoodRx coupon card.  - If you need your prescription sent electronically to a different pharmacy, notify our office through [facility] or by phone at [phone] option 4.     Si Usted Necesita Algo Despus de Su Visita  Tambin puede enviarnos un mensaje a travs de Clinical cytogeneticist. Por lo general respondemos a los mensajes de MyChart en el transcurso de 1 a 2 das hbiles.  Para renovar recetas, por favor pida a su farmacia que se ponga en contacto con nuestra oficina. Randi lakes de fax es [place] [phone].  Si tiene un asunto urgente cuando la clnica est cerrada y que no puede esperar hasta el siguiente da hbil, puede llamar/localizar a su doctor(a) al nmero que  aparece a continuacin.   Por favor, tenga en cuenta que aunque hacemos todo lo posible para estar disponibles para asuntos urgentes fuera del horario de [place], no estamos disponibles las 24 horas del da, los 7 [address]  [address] de la [place].   Si tiene un problema urgente y no puede comunicarse con nosotros, puede optar por buscar atencin mdica  en el consultorio de su doctor(a), en una clnica privada, en un centro de atencin urgente o en una sala de emergencias.  Si tiene Engineer, drilling, por favor llame inmediatamente al 911 o vaya a la sala de emergencias.  Nmeros de bper  - Dr. Hester: [phone]  - Dra. Jackquline: [phone]  - Dr. Claudene: [phone]  - Dra. Kitts: [phone]  En caso de inclemencias del [place], por favor llame a nuestra lnea principal al [phone] para una actualizacin sobre el estado de cualquier retraso o cierre.  Consejos para la medicacin en dermatologa: Por favor, guarde las cajas en las que vienen los medicamentos de uso tpico para ayudarle a seguir las instrucciones sobre dnde y cmo usarlos. Las farmacias generalmente imprimen las instrucciones del medicamento slo en las cajas y no directamente en los tubos del [place].   Si su medicamento es muy caro, por favor, pngase en contacto con landry rieger llamando al [phone] y presione la opcin 4 o envenos un mensaje a travs de Clinical cytogeneticist.   No podemos decirle cul ser su copago por los medicamentos por adelantado ya que esto es diferente dependiendo de la cobertura de su seguro. Sin embargo, es posible que podamos encontrar un medicamento sustituto a Audiological scientist un formulario para que el seguro cubra el medicamento que se considera necesario.   Si se requiere una autorizacin previa para que su compaa de seguros malta su medicamento, por favor permtanos de 1 a 2 das hbiles para completar este proceso.  Los precios de los medicamentos varan con frecuencia  dependiendo del Environmental consultant de dnde se surte la receta y alguna farmacias pueden ofrecer precios ms baratos.  El sitio web www.goodrx.com tiene cupones para medicamentos de Health and safety inspector. Los precios aqu no tienen en cuenta lo que podra costar con la ayuda del seguro (puede ser ms barato con su seguro), pero el sitio web puede darle el precio si no utiliz Tourist information centre manager.  - Puede imprimir el cupn  correspondiente y llevarlo con su receta a la farmacia.  - Tambin puede pasar por nuestra oficina durante el horario de atencin regular y Education officer, museum una tarjeta de cupones de GoodRx.  - Si necesita que su receta se enve electrnicamente a una farmacia diferente, informe a nuestra oficina a travs de MyChart de [place] o por telfono llamando al [phone] y presione la opcin 4.

## 2024-02-12 NOTE — Progress Notes (Deleted)
   Follow-Up Visit   Subjective  Christopher Contreras is a 68 y.o. male who presents for the following: Spot  The patient has spots, moles and lesions to be evaluated, some may be new or changing and the patient may have concern these could be cancer.  ***   The following portions of the chart were reviewed this encounter and updated as appropriate: medications, allergies, medical history  Review of Systems:  No other skin or systemic complaints except as noted in HPI or Assessment and Plan.  Objective  Well appearing patient in no apparent distress; mood and affect are within normal limits.  A focused examination was performed of the following areas: Scalp, face, ears, neck  Relevant physical exam findings are noted in the Assessment and Plan.    Assessment & Plan      No follow-ups on file.  I, Taris Galindo, CMA, am acting as scribe for Alm Rhyme, MD.   Documentation: I have reviewed the above documentation for accuracy and completeness, and I agree with the above.  Alm Rhyme, MD

## 2024-02-12 NOTE — Progress Notes (Unsigned)
 Follow-Up Visit   Subjective  Christopher Contreras is a 68 y.o. male who presents for the following: Skin Cancer Screening and Full Body Skin Exam. Hx of BCCs. Hx of AKs.  Areas of concern on scalp.  The patient presents for Total-Body Skin Exam (TBSE) for skin cancer screening and mole check. The patient has spots, moles and lesions to be evaluated, some may be new or changing and the patient may have concern these could be cancer.  The following portions of the chart were reviewed this encounter and updated as appropriate: medications, allergies, medical history  Review of Systems:  No other skin or systemic complaints except as noted in HPI or Assessment and Plan.  Objective  Well appearing patient in no apparent distress; mood and affect are within normal limits.  A full examination was performed including scalp, head, eyes, ears, nose, lips, neck, chest, axillae, abdomen, back, buttocks, bilateral upper extremities, bilateral lower extremities, hands, feet, fingers, toes, fingernails, and toenails. All findings within normal limits unless otherwise noted below.   Relevant physical exam findings are noted in the Assessment and Plan.  Left Crown Scalp 1.1 cm pink scaly plaque  face and trunk x21 (21) Erythematous keratotic or waxy stuck-on papule or plaque. face, scalp, nose x10 (10) Erythematous thin papules/macules with gritty scale.   Assessment & Plan   SKIN CANCER SCREENING PERFORMED TODAY.  ACTINIC DAMAGE - Chronic condition, secondary to cumulative UV/sun exposure - diffuse scaly erythematous macules with underlying dyspigmentation - Recommend daily broad spectrum sunscreen SPF 30+ to sun-exposed areas, reapply every 2 hours as needed.  - Staying in the shade or wearing long sleeves, sun glasses (UVA+UVB protection) and wide brim hats (4-inch brim around the entire circumference of the hat) are also recommended for sun protection.  - Call for new or changing  lesions.  HISTORY OF BASAL CELL CARCINOMA OF THE SKIN - No evidence of recurrence today - Recommend regular full body skin exams - Recommend daily broad spectrum sunscreen SPF 30+ to sun-exposed areas, reapply every 2 hours as needed.  - Call if any new or changing lesions are noted between office visits  LENTIGINES, SEBORRHEIC KERATOSES, HEMANGIOMAS - Benign normal skin lesions - Benign-appearing - Call for any changes  MELANOCYTIC NEVI - Tan-brown and/or pink-flesh-colored symmetric macules and papules - Benign appearing on exam today - Observation - Call clinic for new or changing moles - Recommend daily use of broad spectrum spf 30+ sunscreen to sun-exposed areas.   NEOPLASM OF SKIN Left Crown Scalp Epidermal / dermal shaving  Lesion diameter (cm):  1.1 Informed consent: discussed and consent obtained   Timeout: patient name, date of birth, surgical site, and procedure verified   Procedure prep:  Patient was prepped and draped in usual sterile fashion Prep type:  Isopropyl alcohol Anesthesia: the lesion was anesthetized in a standard fashion   Anesthetic:  1% lidocaine  w/ epinephrine  1-100,000 buffered w/ 8.4% NaHCO3 Instrument used: flexible razor blade   Hemostasis achieved with: pressure, aluminum chloride and electrodesiccation   Outcome: patient tolerated procedure well   Post-procedure details: sterile dressing applied and wound care instructions given   Dressing type: bandage and petrolatum    Destruction of lesion Complexity: extensive   Destruction method: electrodesiccation and curettage   Informed consent: discussed and consent obtained   Timeout:  patient name, date of birth, surgical site, and procedure verified Procedure prep:  Patient was prepped and draped in usual sterile fashion Prep type:  Isopropyl alcohol Anesthesia:  the lesion was anesthetized in a standard fashion   Anesthetic:  1% lidocaine  w/ epinephrine  1-100,000 buffered w/ 8.4%  NaHCO3 Curettage performed in three different directions: Yes   Electrodesiccation performed over the curetted area: Yes   Curettage cycles:  3 Lesion length (cm):  1.1 Lesion width (cm):  1.1 Margin per side (cm):  0.2 Final wound size (cm):  1.5 Hemostasis achieved with:  pressure and aluminum chloride Outcome: patient tolerated procedure well with no complications   Post-procedure details: sterile dressing applied and wound care instructions given   Dressing type: bandage and petrolatum    Specimen 1 - Surgical pathology Differential Diagnosis: R/O BCC  Check Margins: No  EDC today INFLAMED SEBORRHEIC KERATOSIS (21) face and trunk x21 (21) Symptomatic, irritating, patient would like treated. Destruction of lesion - face and trunk x21 (21) Complexity: simple   Destruction method: cryotherapy   Informed consent: discussed and consent obtained   Timeout:  patient name, date of birth, surgical site, and procedure verified Lesion destroyed using liquid nitrogen: Yes   Region frozen until ice ball extended beyond lesion: Yes   Outcome: patient tolerated procedure well with no complications   Post-procedure details: wound care instructions given   Additional details:  Prior to procedure, discussed risks of blister formation, small wound, skin dyspigmentation, or rare scar following cryotherapy. Recommend Vaseline ointment to treated areas while healing.   AK (ACTINIC KERATOSIS) (10) face, scalp, nose x10 (10) Actinic keratoses are precancerous spots that appear secondary to cumulative UV radiation exposure/sun exposure over time. They are chronic with expected duration over 1 year. A portion of actinic keratoses will progress to squamous cell carcinoma of the skin. It is not possible to reliably predict which spots will progress to skin cancer and so treatment is recommended to prevent development of skin cancer.  Recommend daily broad spectrum sunscreen SPF 30+ to sun-exposed areas,  reapply every 2 hours as needed.  Recommend staying in the shade or wearing long sleeves, sun glasses (UVA+UVB protection) and wide brim hats (4-inch brim around the entire circumference of the hat). Call for new or changing lesions. Destruction of lesion - face, scalp, nose x10 (10) Complexity: simple   Destruction method: cryotherapy   Informed consent: discussed and consent obtained   Timeout:  patient name, date of birth, surgical site, and procedure verified Lesion destroyed using liquid nitrogen: Yes   Region frozen until ice ball extended beyond lesion: Yes   Outcome: patient tolerated procedure well with no complications   Post-procedure details: wound care instructions given   Additional details:  Prior to procedure, discussed risks of blister formation, small wound, skin dyspigmentation, or rare scar following cryotherapy. Recommend Vaseline ointment to treated areas while healing.   SKIN CANCER SCREENING   ACTINIC SKIN DAMAGE   LENTIGO   MELANOCYTIC NEVUS, UNSPECIFIED LOCATION   HISTORY OF BASAL CELL CARCINOMA   Return for [facility], [facility] Follow Up in 6-8 months.  I, Jill Parcell, CMA, am acting as scribe for Alm Rhyme, MD.   Documentation: I have reviewed the above documentation for accuracy and completeness, and I agree with the above.  Alm Rhyme, MD

## 2024-02-13 ENCOUNTER — Ambulatory Visit: Payer: Self-pay | Admitting: Dermatology

## 2024-02-13 LAB — SURGICAL PATHOLOGY

## 2024-02-17 ENCOUNTER — Encounter: Payer: Self-pay | Admitting: Dermatology

## 2024-02-17 NOTE — Telephone Encounter (Addendum)
 Called and discussed results with patient. He verbalized understanding and denied further questions. Will recheck at next follow up.   ----- Message from Alm Rhyme sent at 02/13/2024  4:49 PM EDT ----- FINAL DIAGNOSIS        1. Skin, left crown scalp :       BASAL CELL CARCINOMA, NODULAR AND INFILTRATIVE PATTERNS   Cancer = BCC Already treated Watch for any evidence of recurrence and contact office for re-check if any concerns Recheck next visit ----- Message ----- From: Interface, [facility] Sent: 02/13/2024   4:07 PM EDT To: Alm JAYSON Rhyme, MD

## 2024-05-12 ENCOUNTER — Ambulatory Visit: Admitting: Dermatology

## 2024-05-14 ENCOUNTER — Ambulatory Visit: Payer: TRICARE For Life (TFL) | Admitting: Dermatology

## 2024-07-07 ENCOUNTER — Ambulatory Visit: Admitting: Family Medicine

## 2024-09-28 ENCOUNTER — Ambulatory Visit: Admitting: Dermatology
